# Patient Record
Sex: Female | Born: 1950 | Race: Black or African American | Hispanic: No | Smoking: Former smoker
Health system: Southern US, Community
[De-identification: ages and names within clinical notes are randomized; demographics above are authoritative.]

## PROBLEM LIST (undated history)

## (undated) DIAGNOSIS — R0600 Dyspnea, unspecified: Secondary | ICD-10-CM

## (undated) DIAGNOSIS — I739 Peripheral vascular disease, unspecified: Secondary | ICD-10-CM

## (undated) DIAGNOSIS — I779 Disorder of arteries and arterioles, unspecified: Secondary | ICD-10-CM

## (undated) DIAGNOSIS — R809 Proteinuria, unspecified: Secondary | ICD-10-CM

## (undated) DIAGNOSIS — E119 Type 2 diabetes mellitus without complications: Secondary | ICD-10-CM

## (undated) DIAGNOSIS — E039 Hypothyroidism, unspecified: Secondary | ICD-10-CM

## (undated) DIAGNOSIS — Z87891 Personal history of nicotine dependence: Secondary | ICD-10-CM

## (undated) DIAGNOSIS — I10 Essential (primary) hypertension: Secondary | ICD-10-CM

## (undated) DIAGNOSIS — E1140 Type 2 diabetes mellitus with diabetic neuropathy, unspecified: Secondary | ICD-10-CM

## (undated) DIAGNOSIS — E079 Disorder of thyroid, unspecified: Secondary | ICD-10-CM

## (undated) DIAGNOSIS — B9681 Helicobacter pylori [H. pylori] as the cause of diseases classified elsewhere: Secondary | ICD-10-CM

## (undated) DIAGNOSIS — E11319 Type 2 diabetes mellitus with unspecified diabetic retinopathy without macular edema: Secondary | ICD-10-CM

## (undated) DIAGNOSIS — M1990 Unspecified osteoarthritis, unspecified site: Secondary | ICD-10-CM

## (undated) DIAGNOSIS — D649 Anemia, unspecified: Secondary | ICD-10-CM

## (undated) DIAGNOSIS — E785 Hyperlipidemia, unspecified: Secondary | ICD-10-CM

## (undated) DIAGNOSIS — B351 Tinea unguium: Secondary | ICD-10-CM

## (undated) DIAGNOSIS — M4800 Spinal stenosis, site unspecified: Secondary | ICD-10-CM

## (undated) DIAGNOSIS — R251 Tremor, unspecified: Secondary | ICD-10-CM

## (undated) DIAGNOSIS — K259 Gastric ulcer, unspecified as acute or chronic, without hemorrhage or perforation: Secondary | ICD-10-CM

## (undated) HISTORY — DX: Proteinuria, unspecified: R80.9

## (undated) HISTORY — PX: TONSILLECTOMY: SUR1361

## (undated) HISTORY — DX: Personal history of nicotine dependence: Z87.891

## (undated) HISTORY — DX: Type 2 diabetes mellitus without complications: E11.9

## (undated) HISTORY — DX: Disorder of thyroid, unspecified: E07.9

## (undated) HISTORY — DX: Spinal stenosis, site unspecified: M48.00

## (undated) HISTORY — DX: Essential (primary) hypertension: I10

## (undated) HISTORY — DX: Helicobacter pylori (H. pylori) as the cause of diseases classified elsewhere: K25.9

## (undated) HISTORY — DX: Hyperlipidemia, unspecified: E78.5

## (undated) HISTORY — DX: Tremor, unspecified: R25.1

## (undated) HISTORY — DX: Gastric ulcer, unspecified as acute or chronic, without hemorrhage or perforation: B96.81

---
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
## 1976-03-23 HISTORY — PX: TUBAL LIGATION: SHX77

## 1995-01-22 HISTORY — PX: ABDOMINAL HYSTERECTOMY: SHX81

## 2003-10-16 ENCOUNTER — Emergency Department (HOSPITAL_COMMUNITY): Admission: EM | Admit: 2003-10-16 | Discharge: 2003-10-16 | Payer: Self-pay | Admitting: *Deleted

## 2004-06-10 ENCOUNTER — Emergency Department (HOSPITAL_COMMUNITY): Admission: EM | Admit: 2004-06-10 | Discharge: 2004-06-10 | Payer: Self-pay | Admitting: Emergency Medicine

## 2007-03-24 HISTORY — PX: OTHER SURGICAL HISTORY: SHX169

## 2009-03-25 LAB — HM COLONOSCOPY

## 2011-12-22 LAB — HM DEXA SCAN

## 2012-01-22 LAB — HM MAMMOGRAPHY

## 2012-02-21 HISTORY — PX: DOPPLER ECHOCARDIOGRAPHY: SHX263

## 2013-05-30 ENCOUNTER — Encounter: Payer: Self-pay | Admitting: *Deleted

## 2013-06-01 ENCOUNTER — Ambulatory Visit: Payer: Self-pay | Admitting: Internal Medicine

## 2013-07-10 ENCOUNTER — Ambulatory Visit: Payer: Self-pay | Admitting: Family Medicine

## 2013-10-26 ENCOUNTER — Ambulatory Visit: Payer: Self-pay | Admitting: Family Medicine

## 2013-12-15 DIAGNOSIS — E119 Type 2 diabetes mellitus without complications: Secondary | ICD-10-CM | POA: Insufficient documentation

## 2013-12-15 DIAGNOSIS — E785 Hyperlipidemia, unspecified: Secondary | ICD-10-CM | POA: Insufficient documentation

## 2013-12-15 DIAGNOSIS — I779 Disorder of arteries and arterioles, unspecified: Secondary | ICD-10-CM | POA: Insufficient documentation

## 2013-12-15 DIAGNOSIS — I10 Essential (primary) hypertension: Secondary | ICD-10-CM | POA: Insufficient documentation

## 2013-12-15 DIAGNOSIS — G250 Essential tremor: Secondary | ICD-10-CM | POA: Insufficient documentation

## 2013-12-15 DIAGNOSIS — M48061 Spinal stenosis, lumbar region without neurogenic claudication: Secondary | ICD-10-CM | POA: Insufficient documentation

## 2014-01-19 DIAGNOSIS — R809 Proteinuria, unspecified: Secondary | ICD-10-CM | POA: Insufficient documentation

## 2014-01-19 DIAGNOSIS — H3500 Unspecified background retinopathy: Secondary | ICD-10-CM | POA: Insufficient documentation

## 2014-01-19 DIAGNOSIS — F17200 Nicotine dependence, unspecified, uncomplicated: Secondary | ICD-10-CM | POA: Insufficient documentation

## 2014-10-03 ENCOUNTER — Other Ambulatory Visit: Payer: Self-pay | Admitting: Internal Medicine

## 2014-10-03 DIAGNOSIS — Z1231 Encounter for screening mammogram for malignant neoplasm of breast: Secondary | ICD-10-CM

## 2014-10-10 ENCOUNTER — Other Ambulatory Visit: Payer: Self-pay | Admitting: Family Medicine

## 2014-10-10 ENCOUNTER — Encounter: Payer: Self-pay | Admitting: Family Medicine

## 2014-10-10 ENCOUNTER — Ambulatory Visit
Admission: RE | Admit: 2014-10-10 | Discharge: 2014-10-10 | Disposition: A | Discharge status: Home / Self Care | Payer: Medicare Other | Source: Ambulatory Visit | Attending: Internal Medicine | Admitting: Internal Medicine

## 2014-10-10 DIAGNOSIS — Z87891 Personal history of nicotine dependence: Secondary | ICD-10-CM | POA: Insufficient documentation

## 2014-10-10 DIAGNOSIS — Z1231 Encounter for screening mammogram for malignant neoplasm of breast: Secondary | ICD-10-CM | POA: Diagnosis not present

## 2014-10-10 HISTORY — DX: Personal history of nicotine dependence: Z87.891

## 2014-10-11 ENCOUNTER — Inpatient Hospital Stay: Payer: Medicare Other | Attending: Family Medicine | Admitting: Family Medicine

## 2014-10-11 ENCOUNTER — Ambulatory Visit
Admission: RE | Admit: 2014-10-11 | Discharge: 2014-10-11 | Disposition: A | Discharge status: Home / Self Care | Payer: Medicare Other | Source: Ambulatory Visit | Attending: Family Medicine | Admitting: Family Medicine

## 2014-10-11 DIAGNOSIS — Z122 Encounter for screening for malignant neoplasm of respiratory organs: Secondary | ICD-10-CM

## 2014-10-11 DIAGNOSIS — I2510 Atherosclerotic heart disease of native coronary artery without angina pectoris: Secondary | ICD-10-CM | POA: Diagnosis not present

## 2014-10-11 DIAGNOSIS — Z87891 Personal history of nicotine dependence: Secondary | ICD-10-CM | POA: Insufficient documentation

## 2014-10-11 NOTE — Progress Notes (Signed)
In accordance with CMS guidelines, patient has meet eligibility criteria including age, absence of signs or symptoms of lung cancer, the specific calculation of cigarette smoking pack-years was 48 years and is a current smoker.   A shared decision-making session was conducted prior to the performance of CT scan. This includes one or more decision aids, includes benefits and harms of screening, follow-up diagnostic testing, over-diagnosis, false positive rate, and total radiation exposure.  Counseling on the importance of adherence to annual lung cancer LDCT screening, impact of co-morbidities, and ability or willingness to undergo diagnosis and treatment is imperative for compliance of the program.  Counseling on the importance of continued smoking cessation for former smokers; the importance of smoking cessation for current smokers and information about tobacco cessation interventions have been given to patient including the [place] at [facility], 1800 quit [place], as well as [place] specific smoking cessation programs.  Written order for lung cancer screening with LDCT has been given to the patient and any and all questions have been answered to the best of my abilities.   Yearly follow up will be scheduled by Burgess Estelle, Thoracic Navigator.

## 2014-10-12 ENCOUNTER — Telehealth: Payer: Self-pay | Admitting: *Deleted

## 2014-10-12 NOTE — Telephone Encounter (Signed)
  Oncology Nurse Navigator Documentation    Navigator Encounter Type: Telephone;Screening (10/12/14 1100)               Notified patient of LDCT lung cancer screening results of Lung Rads 2S finding with recommendation for 12 month follow up imaging. Also notified of incidental findings noted below. Patient verbalized understanding. Discussed importance of continuing current medication management for atherosclerotic changes noted but to also discuss with Dr. Ouida Sills at next appointment. This will be forwarded to Dr. Ouida Sills as well.  IMPRESSION: 1. Lung-RADS Category 2S, benign appearance or behavior. Continue annual screening with low-dose chest CT without contrast in 12 months. 2. The "S" modifier above refers to potentially clinically significant non lung cancer related findings. Specifically, there is extensive atherosclerosis, including left main and 2 vessel coronary artery disease. Please note that although the presence of coronary artery calcium documents the presence of coronary artery disease, the severity of this disease and any potential stenosis cannot be assessed on this non-gated CT examination. Assessment for potential risk factor modification, dietary therapy or pharmacologic therapy may be warranted, if clinically indicated.

## 2014-10-30 ENCOUNTER — Telehealth: Payer: Self-pay

## 2014-10-30 NOTE — Telephone Encounter (Signed)
Oncology Nurse Navigator Documentation  Oncology Nurse Navigator Flowsheets 10/12/2014 10/30/2014  Navigator Encounter Type Telephone;Screening Telephone  Time Spent with Patient 15 15   Copt of Low dose CT scan routed thru [facility] to Dr Ouida Sills and hard copy fax sent to his office per pt request, did not receive original copy sent.

## 2014-12-31 DIAGNOSIS — I2510 Atherosclerotic heart disease of native coronary artery without angina pectoris: Secondary | ICD-10-CM | POA: Insufficient documentation

## 2015-07-04 ENCOUNTER — Ambulatory Visit: Payer: Medicare Other | Admitting: Physical Therapy

## 2015-07-09 ENCOUNTER — Encounter: Payer: Medicare Other | Admitting: Physical Therapy

## 2015-07-11 ENCOUNTER — Encounter: Payer: Medicare Other | Admitting: Physical Therapy

## 2015-07-15 ENCOUNTER — Encounter: Payer: Medicare Other | Admitting: Physical Therapy

## 2015-07-18 ENCOUNTER — Encounter: Payer: Medicare Other | Admitting: Physical Therapy

## 2015-07-29 ENCOUNTER — Ambulatory Visit: Payer: Medicare Other | Attending: Internal Medicine | Admitting: Physical Therapy

## 2015-08-01 ENCOUNTER — Encounter: Payer: Medicare Other | Admitting: Physical Therapy

## 2015-08-05 ENCOUNTER — Encounter: Payer: Medicare Other | Admitting: Physical Therapy

## 2015-08-07 DIAGNOSIS — Z0000 Encounter for general adult medical examination without abnormal findings: Secondary | ICD-10-CM | POA: Insufficient documentation

## 2015-08-08 ENCOUNTER — Encounter: Payer: Medicare Other | Admitting: Physical Therapy

## 2015-09-27 ENCOUNTER — Telehealth: Payer: Self-pay | Admitting: *Deleted

## 2015-09-27 NOTE — Telephone Encounter (Signed)
Left voicemail at home number in EMR for patient notifying them that it is time to schedule annual low dose lung cancer screening CT scan. Instructed patient to call back to verify information prior to the scan being scheduled.

## 2015-11-19 ENCOUNTER — Telehealth: Payer: Self-pay | Admitting: *Deleted

## 2015-11-19 NOTE — Telephone Encounter (Signed)
Follow up imaging from last low dose CT lung cancer screening scan is due. Despite multiple attempts at all contact numbers available, have not been able to arrange for CT scan. Letter mailed to patient in final attempt to contact patient. I will be happy to assist in the future if patient so desires. Will forward to referring provider.

## 2016-09-11 ENCOUNTER — Other Ambulatory Visit: Payer: Self-pay | Admitting: Internal Medicine

## 2016-09-11 DIAGNOSIS — M25511 Pain in right shoulder: Secondary | ICD-10-CM

## 2016-09-15 ENCOUNTER — Ambulatory Visit
Admission: RE | Admit: 2016-09-15 | Discharge: 2016-09-15 | Disposition: A | Discharge status: Home / Self Care | Payer: Medicare Other | Source: Ambulatory Visit | Attending: Internal Medicine | Admitting: Internal Medicine

## 2016-09-15 DIAGNOSIS — M62511 Muscle wasting and atrophy, not elsewhere classified, right shoulder: Secondary | ICD-10-CM | POA: Diagnosis not present

## 2016-09-15 DIAGNOSIS — M75121 Complete rotator cuff tear or rupture of right shoulder, not specified as traumatic: Secondary | ICD-10-CM | POA: Insufficient documentation

## 2016-09-15 DIAGNOSIS — M7551 Bursitis of right shoulder: Secondary | ICD-10-CM | POA: Diagnosis not present

## 2016-09-15 DIAGNOSIS — M25511 Pain in right shoulder: Secondary | ICD-10-CM | POA: Insufficient documentation

## 2016-09-24 DIAGNOSIS — M75120 Complete rotator cuff tear or rupture of unspecified shoulder, not specified as traumatic: Secondary | ICD-10-CM | POA: Insufficient documentation

## 2016-10-13 ENCOUNTER — Encounter
Admission: RE | Admit: 2016-10-13 | Discharge: 2016-10-13 | Disposition: A | Discharge status: Home / Self Care | Payer: Medicare Other | Source: Ambulatory Visit | Attending: Orthopedic Surgery | Admitting: Orthopedic Surgery

## 2016-10-13 ENCOUNTER — Other Ambulatory Visit: Payer: Self-pay | Admitting: Orthopedic Surgery

## 2016-10-13 DIAGNOSIS — Z9889 Other specified postprocedural states: Secondary | ICD-10-CM | POA: Diagnosis not present

## 2016-10-13 DIAGNOSIS — F17210 Nicotine dependence, cigarettes, uncomplicated: Secondary | ICD-10-CM | POA: Diagnosis not present

## 2016-10-13 DIAGNOSIS — Z90710 Acquired absence of both cervix and uterus: Secondary | ICD-10-CM | POA: Insufficient documentation

## 2016-10-13 DIAGNOSIS — Z01812 Encounter for preprocedural laboratory examination: Secondary | ICD-10-CM | POA: Insufficient documentation

## 2016-10-13 DIAGNOSIS — M75121 Complete rotator cuff tear or rupture of right shoulder, not specified as traumatic: Secondary | ICD-10-CM | POA: Insufficient documentation

## 2016-10-13 HISTORY — DX: Anemia, unspecified: D64.9

## 2016-10-13 LAB — BASIC METABOLIC PANEL
Anion gap: 9 (ref 5–15)
BUN: 26 mg/dL — AB (ref 6–20)
CALCIUM: 9.2 mg/dL (ref 8.9–10.3)
CO2: 25 mmol/L (ref 22–32)
CREATININE: 0.98 mg/dL (ref 0.44–1.00)
Chloride: 106 mmol/L (ref 101–111)
GFR calc Af Amer: >60 mL/min (ref >60)
GFR, EST NON AFRICAN AMERICAN: 58 mL/min — AB (ref >60)
GLUCOSE: 130 mg/dL — AB (ref 65–99)
POTASSIUM: 4.3 mmol/L (ref 3.5–5.1)
Sodium: 140 mmol/L (ref 135–145)

## 2016-10-13 LAB — CBC WITH DIFFERENTIAL/PLATELET
BASOS ABS: 0.1 10*3/uL (ref 0–0.1)
Basophils Relative: 1 %
EOS PCT: 1 %
Eosinophils Absolute: 0.1 10*3/uL (ref 0–0.7)
HCT: 34.1 % — ABNORMAL LOW (ref 35.0–47.0)
Hemoglobin: 10.8 g/dL — ABNORMAL LOW (ref 12.0–16.0)
LYMPHS PCT: 36 %
Lymphs Abs: 3.1 10*3/uL (ref 1.0–3.6)
MCH: 27 pg (ref 26.0–34.0)
MCHC: 31.7 g/dL — ABNORMAL LOW (ref 32.0–36.0)
MCV: 85.2 fL (ref 80.0–100.0)
MONO ABS: 0.6 10*3/uL (ref 0.2–0.9)
Monocytes Relative: 7 %
Neutro Abs: 4.8 10*3/uL (ref 1.4–6.5)
Neutrophils Relative %: 55 %
PLATELETS: 312 10*3/uL (ref 150–440)
RBC: 4 MIL/uL (ref 3.80–5.20)
RDW: 16.1 % — AB (ref 11.5–14.5)
WBC: 8.6 10*3/uL (ref 3.6–11.0)

## 2016-10-13 LAB — PROTIME-INR
INR: 0.92
Prothrombin Time: 12.3 seconds (ref 11.4–15.2)

## 2016-10-13 LAB — APTT: APTT: 28 s (ref 24–36)

## 2016-10-13 NOTE — Patient Instructions (Signed)
  Your procedure is scheduled on: Tuesday October 20, 2016. Report to Same Day Surgery. To find out your arrival time please call [phone] between 1PM - 3PM on Monday October 19, 2016.  Remember: Instructions that are not followed completely may result in serious medical risk, up to and including death, or upon the discretion of your surgeon and anesthesiologist your surgery may need to be rescheduled.    _x___ 1. Do not eat food or drink liquids after midnight. No gum chewing or hard candies.     _x__ 2. No Alcohol for 24 hours before or after surgery.   ____ 3. Bring all medications with you on the day of surgery if instructed.    __x__ 4. Notify your doctor if there is any change in your medical condition     (cold, fever, infections).    _____ 5. No smoking 24 hours prior to surgery.     Do not wear jewelry, make-up, hairpins, clips or nail polish.  Do not wear lotions, powders, or perfumes.   Do not shave 48 hours prior to surgery. Men may shave face and neck.  Do not bring valuables to the hospital.    [facility] is not responsible for any belongings or valuables.               Contacts, dentures or bridgework may not be worn into surgery.  Leave your suitcase in the car. After surgery it may be brought to your room.  For patients admitted to the hospital, discharge time is determined by your treatment team.   Patients discharged the day of surgery will not be allowed to drive home.    Please read over the following fact sheets that you were given:   [facility] Preparing for Surgery  __x__ Take these medicines the morning of surgery with A SIP OF WATER:    1. diltiazem (CARDIZEM CD)   2. losartan (COZAAR)  ____ Fleet Enema (as directed)   _x___ Use CHG Soap as directed on instruction sheet  ____ Use inhalers on the day of surgery and bring to hospital day of surgery  _x___ Stop metformin 2 days prior to surgery.    _x___ Take 1/2 of usual insulin dose the night  before surgery and none on the morning of surgery.   x__ Stop aspirin on 7 days prior to surgery.  _x___ Stop Anti-inflammatories such as Advil, Aleve, Ibuprofen, Motrin, Naproxen, Naprosyn, Goodies powders or aspirin  products. OK to take Tylenol.   _x___ Stop supplements:Krill Oil & Coenzyme Q10 (CO Q-10) & ferrous sulfate 325 (65 FE) until after surgery.    ____ Bring C-Pap to the hospital.

## 2016-10-13 NOTE — Pre-Procedure Instructions (Signed)
Request for medical records faxed to Dr. Laurelyn Sickle office for Stress test, Echo and any labs.

## 2016-10-19 MED ORDER — CEFAZOLIN SODIUM-DEXTROSE 2-4 GM/100ML-% IV SOLN
2.0000 g | INTRAVENOUS | Status: AC
  Administered 2016-10-20: 2 g via INTRAVENOUS

## 2016-10-19 NOTE — Pre-Procedure Instructions (Signed)
See requested clearance, EKG, stress test results in front of pt's chart.

## 2016-10-20 ENCOUNTER — Encounter: Payer: Self-pay | Admitting: Anesthesiology

## 2016-10-20 ENCOUNTER — Ambulatory Visit: Payer: Medicare Other | Admitting: Anesthesiology

## 2016-10-20 ENCOUNTER — Encounter
Admission: RE | Disposition: A | Discharge status: Home / Self Care | Payer: Self-pay | Source: Ambulatory Visit | Attending: Orthopedic Surgery

## 2016-10-20 ENCOUNTER — Ambulatory Visit
Admission: RE | Admit: 2016-10-20 | Discharge: 2016-10-20 | Disposition: A | Discharge status: Home / Self Care | Payer: Medicare Other | Source: Ambulatory Visit | Attending: Orthopedic Surgery | Admitting: Orthopedic Surgery

## 2016-10-20 DIAGNOSIS — K279 Peptic ulcer, site unspecified, unspecified as acute or chronic, without hemorrhage or perforation: Secondary | ICD-10-CM | POA: Diagnosis not present

## 2016-10-20 DIAGNOSIS — F17200 Nicotine dependence, unspecified, uncomplicated: Secondary | ICD-10-CM | POA: Diagnosis not present

## 2016-10-20 DIAGNOSIS — E079 Disorder of thyroid, unspecified: Secondary | ICD-10-CM | POA: Diagnosis not present

## 2016-10-20 DIAGNOSIS — D649 Anemia, unspecified: Secondary | ICD-10-CM | POA: Diagnosis not present

## 2016-10-20 DIAGNOSIS — E119 Type 2 diabetes mellitus without complications: Secondary | ICD-10-CM | POA: Insufficient documentation

## 2016-10-20 DIAGNOSIS — M19011 Primary osteoarthritis, right shoulder: Secondary | ICD-10-CM | POA: Diagnosis not present

## 2016-10-20 DIAGNOSIS — Y939 Activity, unspecified: Secondary | ICD-10-CM | POA: Insufficient documentation

## 2016-10-20 DIAGNOSIS — X500XXA Overexertion from strenuous movement or load, initial encounter: Secondary | ICD-10-CM | POA: Insufficient documentation

## 2016-10-20 DIAGNOSIS — Z90710 Acquired absence of both cervix and uterus: Secondary | ICD-10-CM | POA: Insufficient documentation

## 2016-10-20 DIAGNOSIS — S46011A Strain of muscle(s) and tendon(s) of the rotator cuff of right shoulder, initial encounter: Secondary | ICD-10-CM | POA: Diagnosis not present

## 2016-10-20 DIAGNOSIS — X509XXA Other and unspecified overexertion or strenuous movements or postures, initial encounter: Secondary | ICD-10-CM | POA: Insufficient documentation

## 2016-10-20 DIAGNOSIS — X58XXXA Exposure to other specified factors, initial encounter: Secondary | ICD-10-CM | POA: Diagnosis not present

## 2016-10-20 DIAGNOSIS — Y93E9 Activity, other interior property and clothing maintenance: Secondary | ICD-10-CM | POA: Diagnosis not present

## 2016-10-20 DIAGNOSIS — E7800 Pure hypercholesterolemia, unspecified: Secondary | ICD-10-CM | POA: Insufficient documentation

## 2016-10-20 DIAGNOSIS — I10 Essential (primary) hypertension: Secondary | ICD-10-CM | POA: Diagnosis not present

## 2016-10-20 DIAGNOSIS — M7541 Impingement syndrome of right shoulder: Secondary | ICD-10-CM | POA: Diagnosis not present

## 2016-10-20 DIAGNOSIS — M75121 Complete rotator cuff tear or rupture of right shoulder, not specified as traumatic: Secondary | ICD-10-CM | POA: Diagnosis present

## 2016-10-20 DIAGNOSIS — S46111A Strain of muscle, fascia and tendon of long head of biceps, right arm, initial encounter: Secondary | ICD-10-CM | POA: Diagnosis not present

## 2016-10-20 HISTORY — PX: SHOULDER ARTHROSCOPY WITH ROTATOR CUFF REPAIR: SHX5685

## 2016-10-20 LAB — GLUCOSE, CAPILLARY
GLUCOSE-CAPILLARY: 182 mg/dL — AB (ref 65–99)
Glucose-Capillary: 185 mg/dL — ABNORMAL HIGH (ref 65–99)

## 2016-10-20 SURGERY — ARTHROSCOPY, SHOULDER, WITH ROTATOR CUFF REPAIR
Anesthesia: General | Laterality: Right

## 2016-10-20 MED ORDER — FENTANYL CITRATE (PF) 100 MCG/2ML IJ SOLN
50.0000 ug | Freq: Once | INTRAMUSCULAR | Status: AC
  Administered 2016-10-20: 50 ug via INTRAVENOUS

## 2016-10-20 MED ORDER — ROPIVACAINE HCL 5 MG/ML IJ SOLN
INTRAMUSCULAR | Status: DC | PRN
  Administered 2016-10-20: 20 mL via EPIDURAL

## 2016-10-20 MED ORDER — OXYCODONE HCL 5 MG PO TABS: 5.0000 mg | ORAL_TABLET | Freq: Four times a day (QID) | ORAL | 0 refills | Status: DC | PRN

## 2016-10-20 MED ORDER — SODIUM CHLORIDE 0.9 % IV SOLN
INTRAVENOUS | Status: DC
  Administered 2016-10-20 (×2): via INTRAVENOUS

## 2016-10-20 MED ORDER — FENTANYL CITRATE (PF) 100 MCG/2ML IJ SOLN: 25.0000 ug | INTRAMUSCULAR | Status: DC | PRN

## 2016-10-20 MED ORDER — FENTANYL CITRATE (PF) 100 MCG/2ML IJ SOLN
INTRAMUSCULAR | Status: DC | PRN
  Administered 2016-10-20: 100 ug via INTRAVENOUS
  Administered 2016-10-20: 50 ug via INTRAVENOUS

## 2016-10-20 MED ORDER — FENTANYL CITRATE (PF) 100 MCG/2ML IJ SOLN
INTRAMUSCULAR | Status: AC
  Administered 2016-10-20: 50 ug via INTRAVENOUS
  Filled 2016-10-20: qty 2

## 2016-10-20 MED ORDER — PROPOFOL 10 MG/ML IV BOLUS
INTRAVENOUS | Status: AC
  Filled 2016-10-20: qty 40

## 2016-10-20 MED ORDER — FENTANYL CITRATE (PF) 250 MCG/5ML IJ SOLN
INTRAMUSCULAR | Status: AC
  Filled 2016-10-20: qty 5

## 2016-10-20 MED ORDER — CEFAZOLIN SODIUM-DEXTROSE 2-4 GM/100ML-% IV SOLN
INTRAVENOUS | Status: AC
  Filled 2016-10-20: qty 100

## 2016-10-20 MED ORDER — MIDAZOLAM HCL 2 MG/2ML IJ SOLN
1.0000 mg | Freq: Once | INTRAMUSCULAR | Status: AC
  Administered 2016-10-20: 1 mg via INTRAVENOUS

## 2016-10-20 MED ORDER — ONDANSETRON HCL 4 MG/2ML IJ SOLN
INTRAMUSCULAR | Status: DC | PRN
  Administered 2016-10-20: 4 mg via INTRAVENOUS

## 2016-10-20 MED ORDER — PHENYLEPHRINE HCL 10 MG/ML IJ SOLN
INTRAMUSCULAR | Status: DC | PRN
  Administered 2016-10-20 (×2): 100 ug via INTRAVENOUS

## 2016-10-20 MED ORDER — MIDAZOLAM HCL 2 MG/2ML IJ SOLN
INTRAMUSCULAR | Status: AC
  Filled 2016-10-20: qty 2

## 2016-10-20 MED ORDER — MIDAZOLAM HCL 2 MG/2ML IJ SOLN
INTRAMUSCULAR | Status: AC
  Administered 2016-10-20: 1 mg via INTRAVENOUS
  Filled 2016-10-20: qty 2

## 2016-10-20 MED ORDER — CHLORHEXIDINE GLUCONATE CLOTH 2 % EX PADS: 6.0000 | MEDICATED_PAD | Freq: Once | CUTANEOUS | Status: DC

## 2016-10-20 MED ORDER — CHLORHEXIDINE GLUCONATE CLOTH 2 % EX PADS
6.0000 | MEDICATED_PAD | Freq: Once | CUTANEOUS | Status: AC
  Administered 2016-10-20: 6 via TOPICAL

## 2016-10-20 MED ORDER — PROPOFOL 10 MG/ML IV BOLUS
INTRAVENOUS | Status: DC | PRN
  Administered 2016-10-20: 150 mg via INTRAVENOUS

## 2016-10-20 MED ORDER — ONDANSETRON HCL 4 MG/2ML IJ SOLN: 4.0000 mg | Freq: Once | INTRAMUSCULAR | Status: DC | PRN

## 2016-10-20 MED ORDER — FAMOTIDINE 20 MG PO TABS
20.0000 mg | ORAL_TABLET | Freq: Once | ORAL | Status: AC
  Administered 2016-10-20: 20 mg via ORAL

## 2016-10-20 MED ORDER — ACETAMINOPHEN 10 MG/ML IV SOLN
INTRAVENOUS | Status: DC | PRN
  Administered 2016-10-20: 1000 mg via INTRAVENOUS

## 2016-10-20 MED ORDER — LIDOCAINE HCL (PF) 1 % IJ SOLN
INTRAMUSCULAR | Status: AC
  Filled 2016-10-20: qty 30

## 2016-10-20 MED ORDER — ROPIVACAINE HCL 5 MG/ML IJ SOLN
INTRAMUSCULAR | Status: AC
  Filled 2016-10-20: qty 60

## 2016-10-20 MED ORDER — EPHEDRINE SULFATE 50 MG/ML IJ SOLN
INTRAMUSCULAR | Status: DC | PRN
  Administered 2016-10-20 (×4): 10 mg via INTRAVENOUS

## 2016-10-20 MED ORDER — ONDANSETRON HCL 4 MG PO TABS: 4.0000 mg | ORAL_TABLET | Freq: Three times a day (TID) | ORAL | 0 refills | Status: DC | PRN

## 2016-10-20 MED ORDER — ONDANSETRON HCL 4 MG/2ML IJ SOLN
INTRAMUSCULAR | Status: AC
  Filled 2016-10-20: qty 2

## 2016-10-20 MED ORDER — ACETAMINOPHEN 10 MG/ML IV SOLN
INTRAVENOUS | Status: AC
  Filled 2016-10-20: qty 100

## 2016-10-20 MED ORDER — SUGAMMADEX SODIUM 200 MG/2ML IV SOLN
INTRAVENOUS | Status: DC | PRN
  Administered 2016-10-20: 200 mg via INTRAVENOUS

## 2016-10-20 MED ORDER — ROCURONIUM BROMIDE 50 MG/5ML IV SOLN
INTRAVENOUS | Status: AC
  Filled 2016-10-20: qty 1

## 2016-10-20 MED ORDER — LIDOCAINE HCL (CARDIAC) 20 MG/ML IV SOLN
INTRAVENOUS | Status: DC | PRN
  Administered 2016-10-20: 100 mg via INTRAVENOUS

## 2016-10-20 MED ORDER — BUPIVACAINE HCL (PF) 0.25 % IJ SOLN
INTRAMUSCULAR | Status: AC
  Filled 2016-10-20: qty 30

## 2016-10-20 MED ORDER — EPINEPHRINE 30 MG/30ML IJ SOLN
INTRAMUSCULAR | Status: AC
  Filled 2016-10-20: qty 1

## 2016-10-20 MED ORDER — NEOMYCIN-POLYMYXIN B GU 40-200000 IR SOLN
Status: AC
  Filled 2016-10-20: qty 2

## 2016-10-20 MED ORDER — FAMOTIDINE 20 MG PO TABS
ORAL_TABLET | ORAL | Status: AC
  Administered 2016-10-20: 20 mg via ORAL
  Filled 2016-10-20: qty 1

## 2016-10-20 MED ORDER — LACTATED RINGERS IV SOLN: INTRAVENOUS | Status: DC | PRN

## 2016-10-20 SURGICAL SUPPLY — 72 items
ADAPTER IRRIG TUBE 2 SPIKE SOL (ADAPTER) ×6 IMPLANT
ADPR TBG 2 SPK PMP STRL ASCP (ADAPTER) ×2
ANCH SUT Q-FX 2.8 (Anchor) ×1 IMPLANT
ANCHOR ALL-SUT Q-FIX 2.8 (Anchor) ×2 IMPLANT
BUR RADIUS 4.0X18.5 (BURR) ×3 IMPLANT
BUR RADIUS 5.5 (BURR) ×3 IMPLANT
CANISTER SUCT LVC 12 LTR MEDI- (MISCELLANEOUS) IMPLANT
CANNULA 5.75X7 CRYSTAL CLEAR (CANNULA) ×6 IMPLANT
CANNULA PARTIAL THREAD 2X7 (CANNULA) ×3 IMPLANT
CANNULA TWIST IN 8.25X9CM (CANNULA) IMPLANT
CLOSURE WOUND 1/2 X4 (GAUZE/BANDAGES/DRESSINGS) ×2
CONNECTOR PERFECT PASSER (CONNECTOR) ×2 IMPLANT
COOLER POLAR GLACIER W/PUMP (MISCELLANEOUS) ×3 IMPLANT
CRADLE LAMINECT ARM (MISCELLANEOUS) ×3 IMPLANT
DEVICE SUCT BLK HOLE OR FLOOR (MISCELLANEOUS) IMPLANT
DRAPE IMP U-DRAPE 54X76 (DRAPES) ×6 IMPLANT
DRAPE INCISE IOBAN 66X45 STRL (DRAPES) ×3 IMPLANT
DRAPE SHEET LG 3/4 BI-LAMINATE (DRAPES) ×3 IMPLANT
DRAPE U-SHAPE 47X51 STRL (DRAPES) IMPLANT
DURAPREP 26ML APPLICATOR (WOUND CARE) ×9 IMPLANT
ELECT REM PT RETURN 9FT ADLT (ELECTROSURGICAL) ×3
ELECTRODE REM PT RTRN 9FT ADLT (ELECTROSURGICAL) ×1 IMPLANT
GAUZE PETRO XEROFOAM 1X8 (MISCELLANEOUS) ×3 IMPLANT
GAUZE SPONGE 4X4 12PLY STRL (GAUZE/BANDAGES/DRESSINGS) ×6 IMPLANT
GLOVE BIOGEL PI IND STRL 9 (GLOVE) ×1 IMPLANT
GLOVE BIOGEL PI INDICATOR 9 (GLOVE) ×2
GLOVE SURG 9.0 ORTHO LTXF (GLOVE) ×6 IMPLANT
GOWN STRL REUS TWL 2XL XL LVL4 (GOWN DISPOSABLE) ×3 IMPLANT
GOWN STRL REUS W/ TWL LRG LVL3 (GOWN DISPOSABLE) ×1 IMPLANT
GOWN STRL REUS W/ TWL LRG LVL4 (GOWN DISPOSABLE) ×1 IMPLANT
GOWN STRL REUS W/TWL LRG LVL3 (GOWN DISPOSABLE) ×3
GOWN STRL REUS W/TWL LRG LVL4 (GOWN DISPOSABLE) ×3
IV LACTATED RINGER IRRG 3000ML (IV SOLUTION) ×30
IV LR IRRIG 3000ML ARTHROMATIC (IV SOLUTION) ×6 IMPLANT
KIT RM TURNOVER STRD PROC AR (KITS) ×3 IMPLANT
KIT STABILIZATION SHOULDER (MISCELLANEOUS) ×3 IMPLANT
KIT SUTURE 2.8 Q-FIX DISP (MISCELLANEOUS) ×2 IMPLANT
KIT SUTURETAK 3.0 INSERT PERC (KITS) IMPLANT
MANIFOLD NEPTUNE II (INSTRUMENTS) ×3 IMPLANT
MASK FACE SPIDER DISP (MASK) ×3 IMPLANT
MAT BLUE FLOOR 46X72 FLO (MISCELLANEOUS) ×6 IMPLANT
NDL SAFETY 18GX1.5 (NEEDLE) ×3 IMPLANT
NDL SAFETY 22GX1.5 (NEEDLE) ×3 IMPLANT
NS IRRIG 500ML POUR BTL (IV SOLUTION) ×3 IMPLANT
PACK ARTHROSCOPY SHOULDER (MISCELLANEOUS) ×3 IMPLANT
PAD WRAPON POLAR SHDR XLG (MISCELLANEOUS) ×1 IMPLANT
PASSER SUT CAPTURE FIRST (SUTURE) ×2 IMPLANT
SET TUBE SUCT SHAVER OUTFL 24K (TUBING) ×3 IMPLANT
SET TUBE TIP INTRA-ARTICULAR (MISCELLANEOUS) ×3 IMPLANT
STRAP SAFETY BODY (MISCELLANEOUS) ×3 IMPLANT
STRIP CLOSURE SKIN 1/2X4 (GAUZE/BANDAGES/DRESSINGS) ×4 IMPLANT
SUT ETHILON 4-0 (SUTURE) ×6
SUT ETHILON 4-0 FS2 18XMFL BLK (SUTURE) ×2
SUT KNTLS 2.8 MAGNUM (Anchor) ×6 IMPLANT
SUT LASSO 90 DEG SD STR (SUTURE) IMPLANT
SUT MNCRL 4-0 (SUTURE) ×3
SUT MNCRL 4-0 27XMFL (SUTURE) ×1
SUT PDS AB 0 CT1 27 (SUTURE) ×3 IMPLANT
SUT PERFECTPASSER WHITE CART (SUTURE) ×4 IMPLANT
SUT SMART STITCH CARTRIDGE (SUTURE) ×2 IMPLANT
SUT VIC AB 0 CT1 36 (SUTURE) ×3 IMPLANT
SUT VIC AB 2-0 CT2 27 (SUTURE) ×3 IMPLANT
SUTURE ETHLN 4-0 FS2 18XMF BLK (SUTURE) ×1 IMPLANT
SUTURE MAGNUM WIRE 2X48 BLK (SUTURE) IMPLANT
SUTURE MNCRL 4-0 27XMF (SUTURE) ×1 IMPLANT
SYRINGE 10CC LL (SYRINGE) ×3 IMPLANT
TAPE MICROFOAM 4IN (TAPE) ×3 IMPLANT
TUBING ARTHRO INFLOW-ONLY STRL (TUBING) ×3 IMPLANT
TUBING CONNECTING 10 (TUBING) ×2 IMPLANT
TUBING CONNECTING 10' (TUBING) ×1
WAND HAND CNTRL MULTIVAC 90 (MISCELLANEOUS) ×3 IMPLANT
WRAPON POLAR PAD SHDR XLG (MISCELLANEOUS) ×3

## 2016-10-20 NOTE — Anesthesia Procedure Notes (Signed)
Procedure Name: Intubation Date/Time: 10/20/2016 10:01 AM Performed by: Justus Memory Pre-anesthesia Checklist: Patient identified, Patient being monitored, Timeout performed, Emergency Drugs available and Suction available Patient Re-evaluated:Patient Re-evaluated prior to induction Oxygen Delivery Method: Circle system utilized Preoxygenation: Pre-oxygenation with 100% oxygen Induction Type: IV induction Ventilation: Mask ventilation without difficulty Laryngoscope Size: Mac and 3 Grade View: Grade I Tube type: Oral Tube size: 7.0 mm Number of attempts: 1 Airway Equipment and Method: Stylet Placement Confirmation: ETT inserted through vocal cords under direct vision,  positive ETCO2 and breath sounds checked- equal and bilateral Secured at: 21 cm Tube secured with: Tape Dental Injury: Teeth and Oropharynx as per pre-operative assessment

## 2016-10-20 NOTE — Transfer of Care (Signed)
Immediate Anesthesia Transfer of Care Note  Patient: Hannah Sullivan  Procedure(s) Performed: Procedure(s): SHOULDER ARTHROSCOPY WITH OPEN ROTATOR CUFF REPAIR (Right)  Patient Location: PACU  Anesthesia Type:General  Level of Consciousness: sedated  Airway & Oxygen Therapy: Patient Spontanous Breathing and Patient connected to face mask oxygen  Post-op Assessment: Report given to RN and Post -op Vital signs reviewed and stable  Post vital signs: Reviewed and stable  Last Vitals:  Vitals:   10/20/16 0937 10/20/16 0942  BP: (!) 107/55 (!) 115/48  Pulse: 72 67  Resp: 14 (!) 23  Temp:      Last Pain:  Vitals:   10/20/16 0937  TempSrc:   PainSc: 0-No pain         Complications: No apparent anesthesia complications

## 2016-10-20 NOTE — Anesthesia Post-op Follow-up Note (Cosign Needed)
Anesthesia QCDR form completed.        

## 2016-10-20 NOTE — Anesthesia Postprocedure Evaluation (Signed)
Anesthesia Post Note  Patient: Hannah Sullivan  Procedure(s) Performed: Procedure(s) (LRB): SHOULDER ARTHROSCOPY WITH OPEN ROTATOR CUFF REPAIR (Right)  Patient location during evaluation: PACU Anesthesia Type: General Level of consciousness: awake and alert Pain management: pain level controlled Vital Signs Assessment: post-procedure vital signs reviewed and stable Respiratory status: spontaneous breathing, nonlabored ventilation, respiratory function stable and patient connected to nasal cannula oxygen Cardiovascular status: blood pressure returned to baseline and stable Postop Assessment: no signs of nausea or vomiting Anesthetic complications: no     Last Vitals:  Vitals:   10/20/16 1438 10/20/16 1448  BP: (!) 96/54 (!) 102/50  Pulse: 66 80  Resp: 20 20  Temp:  (!) 35.7 C    Last Pain:  Vitals:   10/20/16 1448  TempSrc: Tympanic  PainSc:                  Jamekia Gannett S

## 2016-10-20 NOTE — Anesthesia Preprocedure Evaluation (Signed)
Anesthesia Evaluation  Patient identified by MRN, date of birth, ID band Patient awake    Reviewed: Allergy & Precautions, NPO status , Patient's Chart, lab work & pertinent test results, reviewed documented beta blocker date and time   Airway Mallampati: II  TM Distance: >3 FB     Dental  (+) Chipped   Pulmonary Current Smoker,           Cardiovascular hypertension, Pt. on medications      Neuro/Psych    GI/Hepatic PUD,   Endo/Other  diabetes, Type 2  Renal/GU      Musculoskeletal   Abdominal   Peds  Hematology  (+) anemia ,   Anesthesia Other Findings   Reproductive/Obstetrics                             Anesthesia Physical Anesthesia Plan  ASA: III  Anesthesia Plan: General   Post-op Pain Management:    Induction: Intravenous  PONV Risk Score and Plan:   Airway Management Planned: Oral ETT  Additional Equipment:   Intra-op Plan:   Post-operative Plan:   Informed Consent: I have reviewed the patients History and Physical, chart, labs and discussed the procedure including the risks, benefits and alternatives for the proposed anesthesia with the patient or authorized representative who has indicated his/her understanding and acceptance.     Plan Discussed with: CRNA  Anesthesia Plan Comments:         Anesthesia Quick Evaluation

## 2016-10-20 NOTE — OR Nursing (Signed)
Pt c/o right eye feeling "tight".  Pt keeps rubbing eye. Right eye slightly red and puffy underneath.  Given cold wet washcloth and a small ice pack applied.

## 2016-10-20 NOTE — Op Note (Signed)
10/20/2016  1:30 PM  PATIENT:  Hannah Sullivan  66 y.o. female  PRE-OPERATIVE DIAGNOSIS:  Complete rotator cuff tear or rupture of right shoulder, shoulder impingement, acromioclavicular joint arthrosis, possible biceps tendon tear  POST-OPERATIVE DIAGNOSIS:  Complete rotator cuff tear or rupture of right shoulder, shoulder impingement, acromioclavicular joint arthrosis, partial biceps tendon tear   PROCEDURE:  Procedure(s): SHOULDER ARTHROSCOPY WITH OPEN ROTATOR CUFF REPAIR (Right)  SURGEON:  Surgeon(s) and Role:    Thornton Park, MD - Primary  ANESTHESIA:   local and paracervical block   PREOPERATIVE INDICATIONS:  Hannah Sullivan is a  66 y.o. female with a diagnosis of Complete rotator cuff tear or rupture of right shoulder who failed conservative measures and elected for surgical management.    The risks benefits and alternatives were discussed with the patient preoperatively including but not limited to the risks of infection, bleeding, nerve injury, persistent pain or weakness, failure of the hardware, re-tear of the rotator cuff and the need for further surgery. Medical risks include DVT and pulmonary embolism, myocardial infarction, stroke, pneumonia, respiratory failure and death. Patient understood these risks and wished to proceed.  OPERATIVE IMPLANTS: ArthroCare Magnum 2 anchors x 3 & Smith and Nephew Q Fix anchors x 2  OPERATIVE PROCEDURE: The patient was met in the preoperative area. The right shoulder was signed with the word yes and my initials according the hospital's correct site of surgery protocol. Patient had a interscalene block placed by the anesthesia service in the preoperative area. The patient was brought to the OR and underwent general endotracheal intubation by the anesthesia service.  The patient was placed in a beachchair position. A spider arm positioner was used for this case. Examination under anesthesia revealed no limitation of motion, there was no  instability or sulcus sign.  The patient was prepped and draped in a sterile fashion. A timeout was performed to verify the patient's name, date of birth, medical record number, correct site of surgery and correct procedure to be performed there was also used to verify the patient received antibiotics that all appropriate instruments, implants and radiographs studies were available in the room. Once all in attendance were in agreement case began.  Bony landmarks were drawn out with a surgical marker along with proposed arthroscopy incisions. These were pre-injected with 1% lidocaine plain. An 11 blade was used to establish a posterior portal through which the arthroscope was placed in the glenohumeral joint. A full diagnostic examination of the shoulder was performed. The anterior portal was established under direct visualization with an 18-gauge spinal needle.  A 5.75 mm arthroscopic cannula was placed through the anterior portal.   The intra-articular portion of the biceps tendon was found to have a partial tear involving greater than 50% of the diameter. Therefore the decision was made to perform a tenotomy. An arthroscopic scissor was used to release the biceps tendon off the superior labrum. The arthroscopic shaver was then used to debride the frayed edges of the labrum superior. There were no anterior or superior labral tears seen.  Subscapularis tendon was intact. Patient had a full-thickness tear involving the supraspinatus and infraspinatus with retraction. There were no loose bodies within the inferior recess and no evidence of HAGL lesion.  The arthroscope was then placed in the subacromial space. A lateral portal was then established using an 18-gauge spinal needle for localization.   The greater tuberosity was debrided using a 5.5 mm resector shaver blade to remove all remaining foreign fibers of the  rotator cuff.  Debridement was performed until punctate bleeding was seen at the greater  tuberosity footprint, which will allow for rotator cuff healing.  Extensive bursitis was encountered and debrided using a 4-0 resector shaver blade and a 90 ArthroCare wand from the lateral portal. A subacromial decompression was also performed using a 5.5 mm resector shaver blade from the lateral portal. The 5.5 mm resector shaver blade was then placed through the anterior portal and distal clavicle excision was performed. Three ArthroCare Perfect Pass sutures were placed in the lateral border of the rotator cuff tear. All arthroscopic instruments were then removed and the mini-open portion of the procedure began.   A saber-type incision was made along the lateral border of the acromion. The deltoid muscle was identified and split in line with its fibers which allowed visualization of the rotator cuff. The Perfect Pass sutures previously placed in the lateral border of the rotator cuff werealso brought out through the deltoid split. Two Q-Fix anchors were then placed at the articular margin of the humeral head and greater tuberosity. The four suture limbs of both Q Fix anchors were passed medially through the rotator cuff using a first pass suture passer. The Perfect Pass sutures from the lateral border of the rotator cuff were then anchored to thegreater tuberosity of the humeral head using three Magnum 2 anchors. These anchors were tensioned to allow for anatomic reduction of the rotator cuff to the greater tuberosity footprint. The medial row repair was then completed using an arthroscopic knot tying technique with the Q fix anchor sutures. Once all sutures were tied down, arthroscopic images of the double row repair were taken with the arthroscope both externally and arthroscopically fromthe glenohumeral joint  All incisions were copiously irrigated. The deltoid fascia was repaired using a 0 Vicryl suturean interrupted fashion. The subcutaneous tissue of all incisions were closed with a 2-0  Vicryl. Skin closure for the arthroscopic incisions was performed with 4-0 nylon. The skin edges of the saber incision were approximated with a running 4-0 undyed Monocryl.  A dry sterile dressing including Steri-Strips was applied . The patient was placed in an abduction sling, with a Polar Care sleeve.  All sharp and instrument counts were correct at the conclusion of the case. I was scrubbed and present for the entire case. I spoke with the patient's granddaughter in the post-op consultation room and informed her that the case had been performed without complication and the patient was stable in recovery room.     Timoteo Gaul, MD

## 2016-10-20 NOTE — Anesthesia Procedure Notes (Signed)
Anesthesia Regional Block: Interscalene brachial plexus block   Pre-Anesthetic Checklist: ,, timeout performed, Correct Patient, Correct Site, Correct Laterality, Correct Procedure, Correct Position, site marked, Risks and benefits discussed,  Surgical consent,  Pre-op evaluation,  At surgeon's request and post-op pain management   Prep: Betadine       Needles:  Injection technique: Single-shot  Needle Type: Echogenic Stimulator Needle     Needle Length: 5cm  Needle Gauge: 21     Additional Needles:   Procedures: ultrasound guided, nerve stimulator,,,,,,   Nerve Stimulator or Paresthesia:  Response: biceps flexion, 0.8 mA,   Additional Responses:   Narrative:  Injection made incrementally with aspirations every 5 mL.  Performed by: Personally  Anesthesiologist: Gunnar Bulla  Additional Notes: Functioning IV was confirmed and monitors were applied.  A 41mm 22ga Arrow echogenic stimulator needle was used. Sterile prep and drape,hand hygiene and sterile gloves were used.  Negative aspiration and negative test dose prior to incremental administration of local anesthetic. The patient tolerated the procedure well.  Ultrasound guidance: relevent anatomy identified, needle position confirmed, local anesthetic spread visualized around nerve(s), vascular puncture avoided.  Image printed for medical record. 22ml ropivi and 27ml NS.

## 2016-10-20 NOTE — Discharge Instructions (Signed)

## 2016-10-20 NOTE — H&P (Signed)
The patient has been re-examined, and the chart reviewed, and there have been no interval changes to the documented history and physical.    The risks, benefits, and alternatives have been discussed at length, and the patient is willing to proceed.   

## 2016-12-22 DIAGNOSIS — M545 Low back pain, unspecified: Secondary | ICD-10-CM | POA: Insufficient documentation

## 2017-03-26 ENCOUNTER — Other Ambulatory Visit: Payer: Self-pay | Admitting: Internal Medicine

## 2017-03-26 DIAGNOSIS — Z1231 Encounter for screening mammogram for malignant neoplasm of breast: Secondary | ICD-10-CM

## 2017-05-07 ENCOUNTER — Ambulatory Visit
Admission: RE | Admit: 2017-05-07 | Discharge: 2017-05-07 | Disposition: A | Discharge status: Home / Self Care | Payer: Medicare Other | Source: Ambulatory Visit | Attending: Internal Medicine | Admitting: Internal Medicine

## 2017-05-07 DIAGNOSIS — Z1231 Encounter for screening mammogram for malignant neoplasm of breast: Secondary | ICD-10-CM | POA: Diagnosis present

## 2017-12-08 ENCOUNTER — Inpatient Hospital Stay: Payer: Medicare Other | Attending: Oncology | Admitting: Oncology

## 2017-12-08 ENCOUNTER — Encounter: Payer: Self-pay | Admitting: Oncology

## 2017-12-08 ENCOUNTER — Other Ambulatory Visit: Payer: Self-pay

## 2017-12-08 VITALS — BP 153/63 | HR 71 | Temp 97.2°F | Resp 18 | Ht 61.0 in | Wt 148.4 lb

## 2017-12-08 DIAGNOSIS — F17210 Nicotine dependence, cigarettes, uncomplicated: Secondary | ICD-10-CM | POA: Insufficient documentation

## 2017-12-08 DIAGNOSIS — E119 Type 2 diabetes mellitus without complications: Secondary | ICD-10-CM | POA: Insufficient documentation

## 2017-12-08 DIAGNOSIS — Z7982 Long term (current) use of aspirin: Secondary | ICD-10-CM | POA: Insufficient documentation

## 2017-12-08 DIAGNOSIS — Z794 Long term (current) use of insulin: Secondary | ICD-10-CM | POA: Diagnosis not present

## 2017-12-08 DIAGNOSIS — Z79899 Other long term (current) drug therapy: Secondary | ICD-10-CM | POA: Diagnosis not present

## 2017-12-08 DIAGNOSIS — D509 Iron deficiency anemia, unspecified: Secondary | ICD-10-CM | POA: Diagnosis present

## 2017-12-08 DIAGNOSIS — I10 Essential (primary) hypertension: Secondary | ICD-10-CM | POA: Diagnosis not present

## 2017-12-08 DIAGNOSIS — E785 Hyperlipidemia, unspecified: Secondary | ICD-10-CM | POA: Insufficient documentation

## 2017-12-08 NOTE — Progress Notes (Signed)
Hematology/Oncology Consult note [facility] Telephone:([phone] [phone]   Patient Care Team: Perrin Maltese, MD as PCP - General (Internal Medicine)  REFERRING PROVIDER: Perrin Maltese, MD CHIEF COMPLAINTS/REASON FOR VISIT:  Evaluation of iron deficiency anemia  HISTORY OF PRESENTING ILLNESS:  Hannah Sullivan is a  67 y.o.  female with PMH listed below who was referred to me for evaluation of iron deficiency anemia Patient follows up with primary care physician Dr. Yancey Flemings recently had labs done on 12/01/2017 at [facility] clinic. Labs reviewed.  CBC showed hemoglobin 7.6, hematocrit 27, MCV 76.5, WBC 10.8, platelet count 425.  Differential is normal CMP showed normal creatinine and bilirubin level. 11/18/2017 iron panel showed TIBC 537, saturation 2, 11/18/2017 ferritin 7. History of iron deficiency: She recalls remotely when she was pregnant she used to take iron supplementation.  Currently she takes oral iron supplementation since she got her blood results. Rectal bleeding: Denies reports that she has dark stool after taking oral iron supplementation. Menstrual bleeding/ Vaginal bleeding : Denies Hematemesis or hemoptysis : denies Blood in urine : denies  Pica: Denies Last endoscopy: She had a colon[place] in [place] [facility] Fatigue: reports worsening fatigue. Chronic onset, perisistent, no aggravating or improving factors, no associated symptoms.  SOB: denies Denies weight loss, fever or chills, abdominal pain, chest pain.  Review of Systems  Constitutional: Positive for malaise/fatigue. Negative for chills, fever and weight loss.  HENT: Negative for nosebleeds and sore throat.   Eyes: Negative for double vision, photophobia and redness.  Respiratory: Negative for cough, shortness of breath and wheezing.   Cardiovascular: Negative for chest pain, palpitations and orthopnea.  Gastrointestinal: Positive for constipation. Negative for  abdominal pain, blood in stool, nausea and vomiting.  Genitourinary: Negative for dysuria.  Musculoskeletal: Negative for back pain, myalgias and neck pain.  Skin: Negative for itching and rash.  Neurological: Negative for dizziness, tingling and tremors.  Endo/Heme/Allergies: Negative for environmental allergies. Does not bruise/bleed easily.  Psychiatric/Behavioral: Negative for depression.    MEDICAL HISTORY:  Past Medical History:  Diagnosis Date  . Anemia   . Diabetes mellitus without complication ([place])   . Hyperlipidemia   . Hypertension   . Personal history of tobacco use, presenting hazards to health 10/10/2014  . Protein in urine   . Pyloric ulcer associated with Helicobacter pylori   . Spinal stenosis   . Thyroid disease 1980's   treated with radioactive iodine  . Tremors of nervous system     SURGICAL HISTORY: Past Surgical History:  Procedure Laterality Date  . ABDOMINAL HYSTERECTOMY  1196   Dr [facility], [place], [place] Dr Shana [place]  . CESAREAN SECTION  1971/1978   2 c-sections, Dr [facility], [place], [place]  . [place] ECHOCARDIOGRAPHY  02/2012   Left Corptid Artery-neck, Dr Chryl Heck [facility]  . endoscope  2009   Dr Maceo Pro, [place] ARTHROSCOPY WITH ROTATOR CUFF REPAIR Right 10/20/2016   Procedure: SHOULDER ARTHROSCOPY WITH OPEN ROTATOR CUFF REPAIR;  Surgeon: Thornton Park, MD;  Location: [facility] ORS;  Service: Orthopedics;  Laterality: Right;  . TONSILECTOMY/ADENOIDECTOMY WITH MYRINGOTOMY  1961  . TONSILLECTOMY    . [place]    SOCIAL HISTORY: Social History   Socioeconomic History  . Marital status: Widowed    Spouse name: Not on file  . Number of children: Not on file  . Years of education: Not on file  .  Highest education level: Not on file  Occupational History  . Not on file  Social Needs  . Financial resource strain: Not on file  . Food insecurity:    Worry:  Not on file    Inability: Not on file  . Transportation needs:    Medical: Not on file    Non-medical: Not on file  Tobacco Use  . Smoking status: Current Every Day Smoker    Packs/day: 0.50    Years: 40.00    Pack years: 20.00  . Smokeless tobacco: Never Used  Substance and Sexual Activity  . Alcohol use: No    Comment: 1 beer every 3 months  . Drug use: No  . Sexual activity: Not on file  Lifestyle  . Physical activity:    Days per week: Not on file    Minutes per session: Not on file  . Stress: Not on file  Relationships  . Social connections:    Talks on phone: Not on file    Gets together: Not on file    Attends religious service: Not on file    Active member of club or organization: Not on file    Attends meetings of clubs or organizations: Not on file    Relationship status: Not on file  . Intimate partner violence:    Fear of current or ex partner: Not on file    Emotionally abused: Not on file    Physically abused: Not on file    Forced sexual activity: Not on file  Other Topics Concern  . Not on file  Social History Narrative  . Not on file    FAMILY HISTORY: Family History  Problem Relation Age of Onset  . COPD Mother   . Diabetes Mother   . Osteoporosis Mother   . Heart attack Sister   . Diabetes Sister   . Arthritis Sister   . Breast cancer Neg Hx     ALLERGIES:  has No Known Allergies.  MEDICATIONS:  Current Outpatient Medications  Medication Sig Dispense Refill  . aspirin EC 81 MG tablet Take 81 mg by mouth daily.    Marland Kitchen atorvastatin (LIPITOR) 40 MG tablet Take 40 mg by mouth daily at 6 PM.     . cholecalciferol (VITAMIN D) 400 units TABS tablet Take 400 Units by mouth daily.    . Coenzyme Q10 (CO Q-10) 200 MG CAPS Take 200 mg by mouth daily.    . Cyanocobalamin (VITAMIN B 12 PO) Take 2,500 mcg by mouth 1 day or 1 dose.    . diltiazem (CARDIZEM CD) 360 MG 24 hr capsule Take 360 mg by mouth daily. In am.    . ferrous sulfate 325 (65 FE) MG  tablet Take 325 mg by mouth daily.    . hydrochlorothiazide (HYDRODIURIL) 25 MG tablet Take 25 mg by mouth every morning.     . insulin detemir (LEVEMIR) 100 UNIT/ML injection Inject 30-45 Units into the skin 2 (two) times daily. 30 units in the morning before breakfast & 45 units in the evening before supper    . insulin lispro (HUMALOG) 100 UNIT/ML injection Inject 25 Units into the skin 3 (three) times daily before meals.    Javier Docker Oil 350 MG CAPS Take 350 mg by mouth daily at 3 pm.    . losartan (COZAAR) 100 MG tablet Take 100 mg by mouth daily. In am.    . Menthol, Topical Analgesic, (BIOFREEZE ROLL-ON EX) Apply 1 application topically 4 (four) times  daily as needed (for pain.).    Marland Kitchen metFORMIN (GLUCOPHAGE) 1000 MG tablet Take 1,000 mg by mouth 2 (two) times daily after a meal.     . Multiple Vitamins-Minerals (PRESERVISION AREDS 2 PO) Take 1 tablet by mouth daily at 3 pm.    . ondansetron (ZOFRAN) 4 MG tablet Take 1 tablet (4 mg total) by mouth every 8 (eight) hours as needed for nausea or vomiting. 30 tablet 0  . oxyCODONE (OXY IR/ROXICODONE) 5 MG immediate release tablet Take 1 tablet (5 mg total) by mouth every 6 (six) hours as needed for severe pain. 40 tablet 0   No current facility-administered medications for this visit.      PHYSICAL EXAMINATION: ECOG PERFORMANCE STATUS: 1 - Symptomatic but completely ambulatory Vitals:   12/08/17 1007  BP: (!) 153/63  Pulse: 71  Resp: 18  Temp: (!) 97.2 F (36.2 C)   Filed Weights   12/08/17 1007  Weight: 148 lb 6.4 oz (67.3 kg)    Physical Exam  Constitutional: She is oriented to person, place, and time. No distress.  HENT:  Head: Normocephalic and atraumatic.  Mouth/Throat: Oropharynx is clear and moist.  Eyes: Pupils are equal, round, and reactive to light. EOM are normal. No scleral icterus.  Neck: Normal range of motion. Neck supple.  Cardiovascular: Normal rate, regular rhythm and normal heart sounds.  Pulmonary/Chest:  Effort normal. No respiratory distress. She has no wheezes.  Abdominal: Soft. Bowel sounds are normal. She exhibits no distension and no mass. There is no tenderness.  Musculoskeletal: Normal range of motion. She exhibits no edema or deformity.  Neurological: She is alert and oriented to person, place, and time. No cranial nerve deficit.  Chronic head tremor  Skin: Skin is warm and dry. No rash noted. No erythema.  Psychiatric: She has a normal mood and affect.     LABORATORY DATA:  I have reviewed the data as listed Lab Results  Component Value Date   WBC 8.6 10/13/2016   HGB 10.8 (L) 10/13/2016   HCT 34.1 (L) 10/13/2016   MCV 85.2 10/13/2016   PLT 312 10/13/2016   No results for input(s): NA, K, CL, CO2, GLUCOSE, BUN, CREATININE, CALCIUM, GFRNONAA, GFRAA, PROT, ALBUMIN, AST, ALT, ALKPHOS, BILITOT, BILIDIR, IBILI in the last 8760 hours. Iron/TIBC/Ferritin/ %Sat No results found for: IRON, TIBC, FERRITIN, IRONPCTSAT      ASSESSMENT & PLAN:  1. Iron deficiency anemia, unspecified iron deficiency anemia type    Labs reviewed and discussed with patient.  Consistent with severe iron deficiency anemia. Plan IV iron with Venofer 200mg  weekly x 4 doses. Allergy reactions/infusion reaction including anaphylactic reaction discussed with patient. Other side effects include but not limited to high blood pressure, skin rash, weight gain, leg swelling, etc. Patient voices understanding and willing to proceed. Recommend patient to follow-up with gastroenterology and have colonoscopy for further evaluation.  Per patient she has an appointment for colonoscopy in October. Repeat CBC, iron, TIBC, ferritin, in 8 weeks for reassessment for additional Venofer. Orders Placed This Encounter  Procedures  . CBC with Differential/Platelet    Standing Status:   Future    Standing Expiration Date:   12/09/2018  . Iron and TIBC    Standing Status:   Future    Standing Expiration Date:   12/09/2018  .  Ferritin    Standing Status:   Future    Standing Expiration Date:   12/09/2018    All questions were answered. The patient knows to call  the clinic with any problems questions or concerns.  Return of visit: 8 weeks Thank you for this kind referral and the opportunity to participate in the care of this patient. A copy of today's note is routed to referring provider  Total face to face encounter time for this patient visit was 45 min. >50% of the time was  spent in counseling and coordination of care.    Earlie Server, MD, PhD Hematology Oncology [facility] at [facility] Pager- [phone] 12/08/2017

## 2017-12-08 NOTE — Progress Notes (Signed)
Patient here for initial visit. Pt states she is currenty smoking 3-4 cigarettes a day and is slowly quitting.

## 2017-12-16 ENCOUNTER — Inpatient Hospital Stay: Payer: Medicare Other

## 2017-12-16 VITALS — BP 164/71 | HR 80 | Temp 98.3°F | Resp 20

## 2017-12-16 DIAGNOSIS — D509 Iron deficiency anemia, unspecified: Secondary | ICD-10-CM

## 2017-12-16 MED ORDER — IRON SUCROSE 20 MG/ML IV SOLN
200.0000 mg | Freq: Once | INTRAVENOUS | Status: AC
  Administered 2017-12-16: 200 mg via INTRAVENOUS
  Filled 2017-12-16: qty 10

## 2017-12-16 MED ORDER — SODIUM CHLORIDE 0.9 % IV SOLN
Freq: Once | INTRAVENOUS | Status: AC
  Administered 2017-12-16: 14:00:00 via INTRAVENOUS
  Filled 2017-12-16: qty 250

## 2017-12-16 MED ORDER — SODIUM CHLORIDE 0.9 % IV SOLN: 200.0000 mg | Freq: Once | INTRAVENOUS | Status: DC

## 2017-12-23 ENCOUNTER — Inpatient Hospital Stay: Payer: Medicare Other | Attending: Oncology

## 2017-12-23 VITALS — BP 138/72 | HR 82 | Temp 98.3°F | Resp 20

## 2017-12-23 DIAGNOSIS — F17210 Nicotine dependence, cigarettes, uncomplicated: Secondary | ICD-10-CM | POA: Insufficient documentation

## 2017-12-23 DIAGNOSIS — Z79899 Other long term (current) drug therapy: Secondary | ICD-10-CM | POA: Diagnosis not present

## 2017-12-23 DIAGNOSIS — D509 Iron deficiency anemia, unspecified: Secondary | ICD-10-CM

## 2017-12-23 MED ORDER — SODIUM CHLORIDE 0.9 % IV SOLN: 200.0000 mg | Freq: Once | INTRAVENOUS | Status: DC

## 2017-12-23 MED ORDER — SODIUM CHLORIDE 0.9 % IV SOLN
Freq: Once | INTRAVENOUS | Status: AC
  Administered 2017-12-23: 14:00:00 via INTRAVENOUS
  Filled 2017-12-23: qty 250

## 2017-12-23 MED ORDER — IRON SUCROSE 20 MG/ML IV SOLN
200.0000 mg | Freq: Once | INTRAVENOUS | Status: AC
  Administered 2017-12-23: 200 mg via INTRAVENOUS
  Filled 2017-12-23: qty 10

## 2017-12-24 ENCOUNTER — Telehealth: Payer: Self-pay | Admitting: *Deleted

## 2017-12-24 NOTE — Telephone Encounter (Signed)
-----   Message from [place] sent at 12/23/2017  1:33 PM EDT ----- Is having a colonscopy on 10/16 suppose to stop iron on 10/9 wants to know what needs to be done about the 10/10 appt and can she come the next day 10/17 to get her other treatment done

## 2017-12-24 NOTE — Telephone Encounter (Signed)
Per Dr Tasia Catchings advised pt okay to receive IV Iron prior to colonoscopy.   Advised her that GI was probably referring to stopping the oral iron supplement.  Advised pt to contact GI office to verify as she was very unsure of our advice

## 2017-12-30 ENCOUNTER — Inpatient Hospital Stay: Payer: Medicare Other

## 2017-12-30 DIAGNOSIS — D509 Iron deficiency anemia, unspecified: Secondary | ICD-10-CM

## 2017-12-30 MED ORDER — SODIUM CHLORIDE 0.9 % IV SOLN
Freq: Once | INTRAVENOUS | Status: AC
  Administered 2017-12-30: 14:00:00 via INTRAVENOUS
  Filled 2017-12-30: qty 250

## 2017-12-30 MED ORDER — IRON SUCROSE 20 MG/ML IV SOLN: 200.0000 mg | Freq: Once | INTRAVENOUS | Status: DC

## 2017-12-30 MED ORDER — IRON SUCROSE 20 MG/ML IV SOLN
200.0000 mg | Freq: Once | INTRAVENOUS | Status: AC
  Administered 2017-12-30: 200 mg via INTRAVENOUS
  Filled 2017-12-30: qty 10

## 2018-01-04 ENCOUNTER — Encounter: Payer: Self-pay | Admitting: *Deleted

## 2018-01-05 ENCOUNTER — Ambulatory Visit
Admission: RE | Admit: 2018-01-05 | Discharge: 2018-01-05 | Disposition: A | Discharge status: Home / Self Care | Payer: Medicare Other | Source: Ambulatory Visit | Attending: Internal Medicine | Admitting: Internal Medicine

## 2018-01-05 ENCOUNTER — Encounter
Admission: RE | Disposition: A | Discharge status: Home / Self Care | Payer: Self-pay | Source: Ambulatory Visit | Attending: Internal Medicine

## 2018-01-05 ENCOUNTER — Ambulatory Visit: Admit: 2018-01-05 | Payer: Medicare Other | Admitting: Gastroenterology

## 2018-01-05 ENCOUNTER — Encounter: Payer: Self-pay | Admitting: *Deleted

## 2018-01-05 ENCOUNTER — Ambulatory Visit: Payer: Medicare Other | Admitting: Certified Registered"

## 2018-01-05 DIAGNOSIS — D125 Benign neoplasm of sigmoid colon: Secondary | ICD-10-CM | POA: Insufficient documentation

## 2018-01-05 DIAGNOSIS — F17200 Nicotine dependence, unspecified, uncomplicated: Secondary | ICD-10-CM | POA: Insufficient documentation

## 2018-01-05 DIAGNOSIS — Z7983 Long term (current) use of bisphosphonates: Secondary | ICD-10-CM | POA: Insufficient documentation

## 2018-01-05 DIAGNOSIS — K2970 Gastritis, unspecified, without bleeding: Secondary | ICD-10-CM | POA: Diagnosis not present

## 2018-01-05 DIAGNOSIS — Z794 Long term (current) use of insulin: Secondary | ICD-10-CM | POA: Insufficient documentation

## 2018-01-05 DIAGNOSIS — I2510 Atherosclerotic heart disease of native coronary artery without angina pectoris: Secondary | ICD-10-CM | POA: Diagnosis not present

## 2018-01-05 DIAGNOSIS — Z7982 Long term (current) use of aspirin: Secondary | ICD-10-CM | POA: Diagnosis not present

## 2018-01-05 DIAGNOSIS — K640 First degree hemorrhoids: Secondary | ICD-10-CM | POA: Diagnosis not present

## 2018-01-05 DIAGNOSIS — D123 Benign neoplasm of transverse colon: Secondary | ICD-10-CM | POA: Insufficient documentation

## 2018-01-05 DIAGNOSIS — D509 Iron deficiency anemia, unspecified: Secondary | ICD-10-CM | POA: Diagnosis present

## 2018-01-05 DIAGNOSIS — E11319 Type 2 diabetes mellitus with unspecified diabetic retinopathy without macular edema: Secondary | ICD-10-CM | POA: Insufficient documentation

## 2018-01-05 DIAGNOSIS — E785 Hyperlipidemia, unspecified: Secondary | ICD-10-CM | POA: Diagnosis not present

## 2018-01-05 DIAGNOSIS — R195 Other fecal abnormalities: Secondary | ICD-10-CM | POA: Insufficient documentation

## 2018-01-05 DIAGNOSIS — Z79899 Other long term (current) drug therapy: Secondary | ICD-10-CM | POA: Insufficient documentation

## 2018-01-05 DIAGNOSIS — I10 Essential (primary) hypertension: Secondary | ICD-10-CM | POA: Diagnosis not present

## 2018-01-05 DIAGNOSIS — D124 Benign neoplasm of descending colon: Secondary | ICD-10-CM | POA: Insufficient documentation

## 2018-01-05 DIAGNOSIS — E1140 Type 2 diabetes mellitus with diabetic neuropathy, unspecified: Secondary | ICD-10-CM | POA: Insufficient documentation

## 2018-01-05 DIAGNOSIS — K449 Diaphragmatic hernia without obstruction or gangrene: Secondary | ICD-10-CM | POA: Insufficient documentation

## 2018-01-05 HISTORY — PX: COLONOSCOPY WITH PROPOFOL: SHX5780

## 2018-01-05 HISTORY — DX: Type 2 diabetes mellitus with diabetic neuropathy, unspecified: E11.40

## 2018-01-05 HISTORY — DX: Type 2 diabetes mellitus with unspecified diabetic retinopathy without macular edema: E11.319

## 2018-01-05 HISTORY — DX: Tinea unguium: B35.1

## 2018-01-05 HISTORY — DX: Proteinuria, unspecified: R80.9

## 2018-01-05 HISTORY — PX: ESOPHAGOGASTRODUODENOSCOPY (EGD) WITH PROPOFOL: SHX5813

## 2018-01-05 HISTORY — DX: Disorder of arteries and arterioles, unspecified: I77.9

## 2018-01-05 HISTORY — DX: Dyspnea, unspecified: R06.00

## 2018-01-05 HISTORY — DX: Hypothyroidism, unspecified: E03.9

## 2018-01-05 HISTORY — DX: Peripheral vascular disease, unspecified: I73.9

## 2018-01-05 HISTORY — DX: Unspecified osteoarthritis, unspecified site: M19.90

## 2018-01-05 LAB — GLUCOSE, CAPILLARY: Glucose-Capillary: 132 mg/dL — ABNORMAL HIGH (ref 70–99)

## 2018-01-05 SURGERY — COLONOSCOPY WITH PROPOFOL
Anesthesia: General

## 2018-01-05 MED ORDER — PROPOFOL 10 MG/ML IV BOLUS
INTRAVENOUS | Status: AC
  Filled 2018-01-05: qty 40

## 2018-01-05 MED ORDER — PHENYLEPHRINE HCL 10 MG/ML IJ SOLN
INTRAMUSCULAR | Status: DC | PRN
  Administered 2018-01-05: 200 ug via INTRAVENOUS

## 2018-01-05 MED ORDER — EPHEDRINE SULFATE 50 MG/ML IJ SOLN
INTRAMUSCULAR | Status: DC | PRN
  Administered 2018-01-05 (×2): 10 mg via INTRAVENOUS

## 2018-01-05 MED ORDER — LIDOCAINE HCL (CARDIAC) PF 100 MG/5ML IV SOSY
PREFILLED_SYRINGE | INTRAVENOUS | Status: DC | PRN
  Administered 2018-01-05: 80 mg via INTRAVENOUS

## 2018-01-05 MED ORDER — SODIUM CHLORIDE 0.9 % IV SOLN
INTRAVENOUS | Status: DC
  Administered 2018-01-05: 1000 mL via INTRAVENOUS

## 2018-01-05 MED ORDER — PROPOFOL 500 MG/50ML IV EMUL
INTRAVENOUS | Status: DC | PRN
  Administered 2018-01-05: 100 ug/kg/min via INTRAVENOUS

## 2018-01-05 MED ORDER — FENTANYL CITRATE (PF) 100 MCG/2ML IJ SOLN
INTRAMUSCULAR | Status: AC
  Filled 2018-01-05: qty 2

## 2018-01-05 MED ORDER — FENTANYL CITRATE (PF) 100 MCG/2ML IJ SOLN
INTRAMUSCULAR | Status: DC | PRN
  Administered 2018-01-05 (×2): 50 ug via INTRAVENOUS

## 2018-01-05 MED ORDER — PROPOFOL 10 MG/ML IV BOLUS
INTRAVENOUS | Status: DC | PRN
  Administered 2018-01-05: 70 mg via INTRAVENOUS
  Administered 2018-01-05: 30 mg via INTRAVENOUS

## 2018-01-05 NOTE — Interval H&P Note (Signed)
History and Physical Interval Note:  01/05/2018 9:36 AM  Hannah Sullivan  has presented today for surgery, with the diagnosis of IDA  The various methods of treatment have been discussed with the patient and family. After consideration of risks, benefits and other options for treatment, the patient has consented to  Procedure(s): COLONOSCOPY WITH PROPOFOL (N/A) ESOPHAGOGASTRODUODENOSCOPY (EGD) WITH PROPOFOL (N/A) as a surgical intervention .  The patient's history has been reviewed, patient examined, no change in status, stable for surgery.  I have reviewed the patient's chart and labs.  Questions were answered to the patient's satisfaction.     [place], [place]

## 2018-01-05 NOTE — Anesthesia Postprocedure Evaluation (Signed)
Anesthesia Post Note  Patient: [name]  Procedure(s) Performed: COLONOSCOPY WITH PROPOFOL (N/A ) ESOPHAGOGASTRODUODENOSCOPY (EGD) WITH PROPOFOL (N/A )  Patient location during evaluation: Endoscopy Anesthesia Type: General Level of consciousness: awake and alert Pain management: pain level controlled Vital Signs Assessment: post-procedure vital signs reviewed and stable Respiratory status: spontaneous breathing, nonlabored ventilation, respiratory function stable and patient connected to nasal cannula oxygen Cardiovascular status: blood pressure returned to baseline and stable Postop Assessment: no apparent nausea or vomiting Anesthetic complications: no     Last Vitals:  Vitals:   01/05/18 0906 01/05/18 1022  BP: (!) 172/65 (!) 120/52  Pulse: 71 66  Resp: 20 20  Temp: (!) 36.2 C (!) 36.3 C  SpO2: 100% 98%    Last Pain:  Vitals:   01/05/18 1138  TempSrc:   PainSc: 0-No pain                 Martha Clan

## 2018-01-05 NOTE — Op Note (Signed)
[facility] Gastroenterology Patient Name: Hannah Sullivan Procedure Date: 01/05/2018 9:28 AM MRN: [id number] Account #: [id number] Date of Birth: Jan 02, 1951 Admit Type: Outpatient Age: 67 Room: [facility] ENDO ROOM 3 Gender: Female Note Status: Finalized Procedure:            Upper GI endoscopy Indications:          Suspected upper gastrointestinal bleeding in patient                        with unexplained iron deficiency anemia, Heme positive                        stool Providers:            Benay Pike. [name] MD, MD Referring MD:         Perrin Maltese, MD (Referring MD) Medicines:            Propofol per Anesthesia Complications:        No immediate complications. Procedure:            Pre-Anesthesia Assessment:                       - The risks and benefits of the procedure and the                        sedation options and risks were discussed with the                        patient. All questions were answered and informed                        consent was obtained.                       - Patient identification and proposed procedure were                        verified prior to the procedure by the nurse. The                        procedure was verified in the procedure room.                       - ASA Grade Assessment: III - A patient with severe                        systemic disease.                       - After reviewing the risks and benefits, the patient                        was deemed in satisfactory condition to undergo the                        procedure.                       After obtaining informed consent, the endoscope was  passed under direct vision. Throughout the procedure,                        the patient's blood pressure, pulse, and oxygen                        saturations were monitored continuously. The Endoscope                        was introduced through the mouth, and advanced to the        third part of duodenum. The upper GI endoscopy was                        accomplished without difficulty. The patient tolerated                        the procedure well. Findings:      The examined esophagus was normal.      Patchy moderate inflammation characterized by congestion (edema),       erosions and erythema was found in the gastric body, in the gastric       antrum and in the prepyloric region of the stomach.      A 1 cm hiatal hernia was present.      The examined duodenum was normal.      The exam was otherwise without abnormality. Impression:           - Normal esophagus.                       - Gastritis.                       - 1 cm hiatal hernia.                       - Normal examined duodenum.                       - The examination was otherwise normal.                       - No specimens collected. Recommendation:       - Proceed with colonoscopy Procedure Code(s):    --- Professional ---                       [phone], Esophagogastroduodenoscopy, flexible, transoral;                        diagnostic, including collection of specimen(s) by                        brushing or washing, when performed (separate procedure) Diagnosis Code(s):    --- Professional ---                       R19.5, Other fecal abnormalities                       D50.9, Iron deficiency anemia, unspecified                       K44.9, Diaphragmatic hernia without obstruction or  gangrene                       K29.70, Gastritis, unspecified, without bleeding CPT copyright 2018 American Medical Association. All rights reserved. The codes documented in this report are preliminary and upon coder review may  be revised to meet current compliance requirements. Efrain Sella MD, MD 01/05/2018 9:58:25 AM This report has been signed electronically. Number of Addenda: 0 Note Initiated On: 01/05/2018 9:28 AM      [facility]

## 2018-01-05 NOTE — Anesthesia Post-op Follow-up Note (Signed)
Anesthesia QCDR form completed.        

## 2018-01-05 NOTE — Transfer of Care (Signed)
Immediate Anesthesia Transfer of Care Note  Patient: Hannah Sullivan  Procedure(s) Performed: COLONOSCOPY WITH PROPOFOL (N/A ) ESOPHAGOGASTRODUODENOSCOPY (EGD) WITH PROPOFOL (N/A )  Patient Location: PACU  Anesthesia Type:General  Level of Consciousness: awake and sedated  Airway & Oxygen Therapy: Patient Spontanous Breathing and Patient connected to nasal cannula oxygen  Post-op Assessment: Report given to RN and Post -op Vital signs reviewed and stable  Post vital signs: Reviewed and stable  Last Vitals:  Vitals Value Taken Time  BP 120/52 01/05/2018 10:22 AM  Temp 36.3 C 01/05/2018 10:22 AM  Pulse 63 01/05/2018 10:22 AM  Resp 22 01/05/2018 10:22 AM  SpO2 98 % 01/05/2018 10:22 AM  Vitals shown include unvalidated device data.  Last Pain:  Vitals:   01/05/18 1022  TempSrc: Tympanic  PainSc: 0-No pain         Complications: No apparent anesthesia complications

## 2018-01-05 NOTE — Op Note (Signed)
[facility] Gastroenterology Patient Name: Hannah Sullivan Procedure Date: 01/05/2018 9:27 AM MRN: [id number] Account #: [id number] Date of Birth: [birth year] Admit Type: Outpatient Age: 67 Room: [facility] ENDO ROOM 3 Gender: Female Note Status: Finalized Procedure:            Colonoscopy Indications:          Heme positive stool, Unexplained iron deficiency anemia Providers:            Benay Pike. [name] MD, MD Referring MD:         Perrin Maltese, MD (Referring MD) Medicines:            Propofol per Anesthesia Complications:        No immediate complications. Procedure:            Pre-Anesthesia Assessment:                       - The risks and benefits of the procedure and the                        sedation options and risks were discussed with the                        patient. All questions were answered and informed                        consent was obtained.                       - Patient identification and proposed procedure were                        verified prior to the procedure by the nurse. The                        procedure was verified in the procedure room.                       - ASA Grade Assessment: III - A patient with severe                        systemic disease.                       - After reviewing the risks and benefits, the patient                        was deemed in satisfactory condition to undergo the                        procedure.                       After obtaining informed consent, the colonoscope was                        passed under direct vision. Throughout the procedure,                        the patient's blood pressure, pulse, and oxygen  saturations were monitored continuously. The                        Colonoscope was introduced through the anus and                        advanced to the the cecum, identified by appendiceal                        orifice and ileocecal valve. The colonoscopy  was                        performed without difficulty. The patient tolerated the                        procedure well. The quality of the bowel preparation                        was good. The ileocecal valve, appendiceal orifice, and                        rectum were photographed. Findings:      The perianal and digital rectal examinations were normal. Pertinent       negatives include normal sphincter tone and no palpable rectal lesions.      Three sessile polyps were found in the sigmoid colon, descending colon       and transverse colon. The polyps were 3 to 4 mm in size. These polyps       were removed with a jumbo cold forceps. Resection and retrieval were       complete.      A 6 mm polyp was found in the distal descending colon. The polyp was       sessile. The polyp was removed with a cold snare. Resection and       retrieval were complete.      Non-bleeding internal hemorrhoids were found during retroflexion. The       hemorrhoids were Grade I (internal hemorrhoids that do not prolapse).      The exam was otherwise without abnormality. Impression:           - Three 3 to 4 mm polyps in the sigmoid colon, in the                        descending colon and in the transverse colon, removed                        with a jumbo cold forceps. Resected and retrieved.                       - One 6 mm polyp in the distal descending colon,                        removed with a cold snare. Resected and retrieved.                       - Non-bleeding internal hemorrhoids.                       - The examination was otherwise normal. Recommendation:       -  Patient has a contact number available for                        emergencies. The signs and symptoms of potential                        delayed complications were discussed with the patient.                        Return to normal activities tomorrow. Written discharge                        instructions were provided to the  patient.                       - Resume previous diet.                       - Continue present medications.                       - Repeat colonoscopy is recommended for surveillance.                        The colonoscopy date will be determined after pathology                        results from today's exam become available for review.                       - Return to physician assistant in 2 months.                       - To visualize the small bowel, perform video capsule                        endoscopy in 3 weeks.                       - The findings and recommendations were discussed with                        the patient and their family. Procedure Code(s):    --- Professional ---                       [phone], Colonoscopy, flexible; with removal of tumor(s),                        polyp(s), or other lesion(s) by snare technique                       [id number], 30, Colonoscopy, flexible; with biopsy, single                        or multiple Diagnosis Code(s):    --- Professional ---                       D50.9, Iron deficiency anemia, unspecified                       R19.5, Other fecal abnormalities  K64.0, First degree hemorrhoids                       D12.5, Benign neoplasm of sigmoid colon                       D12.4, Benign neoplasm of descending colon                       D12.3, Benign neoplasm of transverse colon (hepatic                        flexure or splenic flexure) CPT copyright 2018 American Medical Association. All rights reserved. The codes documented in this report are preliminary and upon coder review may  be revised to meet current compliance requirements. Efrain Sella MD, MD 01/05/2018 10:19:29 AM This report has been signed electronically. Number of Addenda: 0 Note Initiated On: 01/05/2018 9:27 AM Scope Withdrawal Time: 0 hours 9 minutes 2 seconds  Total Procedure Duration: 0 hours 15 minutes 23 seconds       [facility]

## 2018-01-05 NOTE — H&P (Signed)
Outpatient short stay form Pre-procedure 01/05/2018 9:33 AM Hannah Sullivan K. [name], M.D.  Primary Physician: Lamonte Sakai, M.D.  Reason for visit:  Iron deficiency anemia, Hemoccult positive stool.   History of present illness:  67 y/o female presents with iron deficiency anemia and was noted to be hemoccult positive of FOBT. Denies abdominal pain, nausea, vomiting, melena, hemetemesis or gross bright red blood per rectum.     Current Facility-Administered Medications:  .  0.9 %  sodium chloride infusion, , Intravenous, Continuous, Mannie Wineland, Benay Pike, MD  Medications Prior to Admission  Medication Sig Dispense Refill Last Dose  . atorvastatin (LIPITOR) 40 MG tablet Take 40 mg by mouth daily at 6 PM.    01/05/2018 at Unknown time  . diltiazem (CARDIZEM CD) 360 MG 24 hr capsule Take 360 mg by mouth daily. In am.   01/05/2018 at Unknown time  . ergocalciferol (VITAMIN D2) 50000 units capsule Take 50,000 Units by mouth once a week.     . folic acid (FOLVITE) 315 MCG tablet Take 400 mcg by mouth 2 (two) times daily.     . hydrochlorothiazide (HYDRODIURIL) 25 MG tablet Take 25 mg by mouth every morning.    01/05/2018 at Unknown time  . losartan (COZAAR) 100 MG tablet Take 100 mg by mouth daily. In am.   01/05/2018 at Unknown time  . MAGNESIUM OXIDE 400 PO Take by mouth daily.     Marland Kitchen alendronate (FOSAMAX) 70 MG tablet Take 70 mg by mouth once a week.  6 Taking  . aspirin EC 81 MG tablet Take 81 mg by mouth daily.   Taking  . Calcium Carbonate-Vitamin D 600-400 MG-UNIT tablet Take by mouth. Take 1 tablet by mouth 2 (two) times daily with meals   Taking  . Cholecalciferol (VITAMIN D3) 50000 units CAPS Take 1 capsule by mouth once a week.  0 Taking  . Coenzyme Q10 (CO Q-10) 200 MG CAPS Take 200 mg by mouth daily.   Not Taking  . Cyanocobalamin (VITAMIN B 12 PO) Take 2,500 mcg by mouth 1 day or 1 dose.   Not Taking  . ferrous sulfate 325 (65 FE) MG tablet Take 325 mg by mouth daily.   Taking  . insulin  detemir (LEVEMIR) 100 UNIT/ML injection Inject 30-45 Units into the skin 2 (two) times daily. 30 units in the morning before breakfast & 45 units in the evening before supper   Not Taking  . Insulin Glargine (LANTUS SOLOSTAR) 100 UNIT/ML Solostar Pen Inject 35 units under the skin in the morning and 45 units under the skin in the evening.   Taking  . insulin lispro (HUMALOG) 100 UNIT/ML injection Inject 25 Units into the skin 3 (three) times daily before meals.   Taking  . Krill Oil 350 MG CAPS Take 350 mg by mouth daily at 3 pm.   Not Taking  . Menthol, Topical Analgesic, (BIOFREEZE ROLL-ON EX) Apply 1 application topically 4 (four) times daily as needed (for pain.).   Taking  . metFORMIN (GLUCOPHAGE) 1000 MG tablet Take 1,000 mg by mouth 2 (two) times daily after a meal.    Taking  . Multiple Vitamins-Minerals (PRESERVISION AREDS 2 PO) Take 1 tablet by mouth daily at 3 pm.   Taking  . nicotine polacrilex (NICORETTE) 2 MG gum Take by mouth. Take 2 mg by mouth as needed for Smoking cessation Chew the gum until it tingles and then park it between your cheek and gum. Repeat   Taking  . ondansetron (  ZOFRAN) 4 MG tablet Take 1 tablet (4 mg total) by mouth every 8 (eight) hours as needed for nausea or vomiting. (Patient not taking: Reported on 12/08/2017) 30 tablet 0 Not Taking  . oxyCODONE (OXY IR/ROXICODONE) 5 MG immediate release tablet Take 1 tablet (5 mg total) by mouth every 6 (six) hours as needed for severe pain. (Patient not taking: Reported on 12/08/2017) 40 tablet 0 Not Taking  . Pyridoxine HCl (VITAMIN B-6 PO) Take by mouth.   Taking     No Known Allergies   Past Medical History:  Diagnosis Date  . Anemia   . Arthritis   . Carotid disease, bilateral ([place])   . Diabetes mellitus without complication ([place])   . Diabetic neuropathy ([place])   . Diabetic retinopathy ([place])   . Dyspnea   . Hyperlipidemia   . Hypertension   . Hypothyroidism   . Microalbuminuria   . Onychomycosis   . Personal  history of tobacco use, presenting hazards to health 10/10/2014  . Protein in urine   . Pyloric ulcer associated with Helicobacter pylori   . Spinal stenosis   . Thyroid disease 1980's   treated with radioactive iodine  . Tremors of nervous system     Review of systems:  Otherwise negative.    Physical Exam  Gen: Alert, oriented. Appears stated age.  HEENT: [place]/AT. PERRLA. Lungs: CTA, no wheezes. CV: RR nl S1, S2. Abd: soft, benign, no masses. BS+ Ext: No edema. Pulses 2+    Planned procedures: Proceed with EGD and colonoscopy. The patient understands the nature of the planned procedure, indications, risks, alternatives and potential complications including but not limited to bleeding, infection, perforation, damage to internal organs and possible oversedation/side effects from anesthesia. The patient agrees and gives consent to proceed.  Please refer to procedure notes for findings, recommendations and patient disposition/instructions.     Julius Boniface K. [name], M.D. Gastroenterology 01/05/2018  9:33 AM

## 2018-01-05 NOTE — Anesthesia Preprocedure Evaluation (Signed)
Anesthesia Evaluation  Patient identified by MRN, date of birth, ID band Patient awake    Reviewed: Allergy & Precautions, H&P , NPO status , Patient's Chart, lab work & pertinent test results, reviewed documented beta blocker date and time   History of Anesthesia Complications Negative for: history of anesthetic complications  Airway Mallampati: I  TM Distance: >3 FB Neck ROM: full    Dental no notable dental hx. (+) Chipped, Dental Advidsory Given, Poor Dentition, Missing   Pulmonary shortness of breath and with exertion, neg sleep apnea, neg COPD, neg recent URI, Current Smoker,           Cardiovascular Exercise Tolerance: Good hypertension, Pt. on medications (-) angina+ CAD  (-) Past MI, (-) Cardiac Stents and (-) CABG (-) dysrhythmias (-) Valvular Problems/Murmurs     Neuro/Psych negative neurological ROS  negative psych ROS   GI/Hepatic negative GI ROS, Neg liver ROS, PUD,   Endo/Other  diabetes, Type 2  Renal/GU negative Renal ROS  negative genitourinary   Musculoskeletal   Abdominal   Peds  Hematology negative hematology ROS (+) Blood dyscrasia, anemia ,   Anesthesia Other Findings Past Medical History: No date: Anemia No date: Arthritis No date: Carotid disease, bilateral (HCC) No date: Diabetes mellitus without complication (HCC) No date: Diabetic neuropathy ([place]) No date: Diabetic retinopathy ([place]) No date: Dyspnea No date: Hyperlipidemia No date: Hypertension No date: Hypothyroidism No date: Microalbuminuria No date: Onychomycosis 10/10/2014: Personal history of tobacco use, presenting hazards to  health No date: Protein in urine No date: Pyloric ulcer associated with Helicobacter pylori No date: Spinal stenosis 1980's: Thyroid disease     Comment:  treated with radioactive iodine No date: Tremors of nervous system   Reproductive/Obstetrics negative OB ROS                              Anesthesia Physical  Anesthesia Plan  ASA: III  Anesthesia Plan: General   Post-op Pain Management:    Induction: Intravenous  PONV Risk Score and Plan: 2 and Propofol infusion and TIVA  Airway Management Planned: Natural Airway and Nasal Cannula  Additional Equipment:   Intra-op Plan:   Post-operative Plan:   Informed Consent: I have reviewed the patients History and Physical, chart, labs and discussed the procedure including the risks, benefits and alternatives for the proposed anesthesia with the patient or authorized representative who has indicated his/her understanding and acceptance.   Dental Advisory Given  Plan Discussed with: CRNA  Anesthesia Plan Comments:         Anesthesia Quick Evaluation

## 2018-01-06 ENCOUNTER — Inpatient Hospital Stay: Payer: Medicare Other

## 2018-01-06 ENCOUNTER — Encounter: Payer: Self-pay | Admitting: Internal Medicine

## 2018-01-06 VITALS — BP 137/71 | HR 64 | Temp 97.6°F | Resp 20

## 2018-01-06 DIAGNOSIS — D509 Iron deficiency anemia, unspecified: Secondary | ICD-10-CM | POA: Diagnosis not present

## 2018-01-06 LAB — SURGICAL PATHOLOGY

## 2018-01-06 MED ORDER — IRON SUCROSE 20 MG/ML IV SOLN
200.0000 mg | Freq: Once | INTRAVENOUS | Status: AC
  Administered 2018-01-06: 200 mg via INTRAVENOUS
  Filled 2018-01-06: qty 10

## 2018-01-06 MED ORDER — SODIUM CHLORIDE 0.9 % IV SOLN
Freq: Once | INTRAVENOUS | Status: AC
  Administered 2018-01-06: 14:00:00 via INTRAVENOUS
  Filled 2018-01-06: qty 250

## 2018-02-02 ENCOUNTER — Inpatient Hospital Stay: Payer: Medicare Other | Attending: Oncology

## 2018-02-02 DIAGNOSIS — D509 Iron deficiency anemia, unspecified: Secondary | ICD-10-CM | POA: Insufficient documentation

## 2018-02-02 DIAGNOSIS — E039 Hypothyroidism, unspecified: Secondary | ICD-10-CM | POA: Diagnosis not present

## 2018-02-02 DIAGNOSIS — E785 Hyperlipidemia, unspecified: Secondary | ICD-10-CM | POA: Insufficient documentation

## 2018-02-02 DIAGNOSIS — E1140 Type 2 diabetes mellitus with diabetic neuropathy, unspecified: Secondary | ICD-10-CM | POA: Diagnosis not present

## 2018-02-02 DIAGNOSIS — Z7982 Long term (current) use of aspirin: Secondary | ICD-10-CM | POA: Insufficient documentation

## 2018-02-02 DIAGNOSIS — K449 Diaphragmatic hernia without obstruction or gangrene: Secondary | ICD-10-CM | POA: Insufficient documentation

## 2018-02-02 DIAGNOSIS — I10 Essential (primary) hypertension: Secondary | ICD-10-CM | POA: Diagnosis not present

## 2018-02-02 DIAGNOSIS — Z794 Long term (current) use of insulin: Secondary | ICD-10-CM | POA: Diagnosis not present

## 2018-02-02 DIAGNOSIS — F17210 Nicotine dependence, cigarettes, uncomplicated: Secondary | ICD-10-CM | POA: Insufficient documentation

## 2018-02-02 DIAGNOSIS — Z79899 Other long term (current) drug therapy: Secondary | ICD-10-CM | POA: Insufficient documentation

## 2018-02-02 LAB — FERRITIN: FERRITIN: 65 ng/mL (ref 11–307)

## 2018-02-02 LAB — CBC WITH DIFFERENTIAL/PLATELET
Abs Immature Granulocytes: 0.02 10*3/uL (ref 0.00–0.07)
Basophils Absolute: 0.1 10*3/uL (ref 0.0–0.1)
Basophils Relative: 1 %
EOS ABS: 0.1 10*3/uL (ref 0.0–0.5)
Eosinophils Relative: 2 %
HCT: 36.5 % (ref 36.0–46.0)
HEMOGLOBIN: 10.9 g/dL — AB (ref 12.0–15.0)
IMMATURE GRANULOCYTES: 0 %
LYMPHS PCT: 38 %
Lymphs Abs: 2.8 10*3/uL (ref 0.7–4.0)
MCH: 25.2 pg — AB (ref 26.0–34.0)
MCHC: 29.9 g/dL — AB (ref 30.0–36.0)
MCV: 84.3 fL (ref 80.0–100.0)
MONOS PCT: 7 %
Monocytes Absolute: 0.5 10*3/uL (ref 0.1–1.0)
NEUTROS PCT: 52 %
Neutro Abs: 3.8 10*3/uL (ref 1.7–7.7)
PLATELETS: 276 10*3/uL (ref 150–400)
RBC: 4.33 MIL/uL (ref 3.87–5.11)
RDW: 22.3 % — ABNORMAL HIGH (ref 11.5–15.5)
WBC: 7.2 10*3/uL (ref 4.0–10.5)
nRBC: 0 % (ref 0.0–0.2)

## 2018-02-02 LAB — IRON AND TIBC
Iron: 58 ug/dL (ref 28–170)
SATURATION RATIOS: 16 % (ref 10.4–31.8)
TIBC: 368 ug/dL (ref 250–450)
UIBC: 310 ug/dL

## 2018-02-04 ENCOUNTER — Inpatient Hospital Stay: Payer: Medicare Other

## 2018-02-04 ENCOUNTER — Encounter: Payer: Self-pay | Admitting: Oncology

## 2018-02-04 ENCOUNTER — Inpatient Hospital Stay (HOSPITAL_BASED_OUTPATIENT_CLINIC_OR_DEPARTMENT_OTHER): Payer: Medicare Other | Admitting: Oncology

## 2018-02-04 ENCOUNTER — Other Ambulatory Visit: Payer: Self-pay

## 2018-02-04 VITALS — BP 140/68 | HR 70 | Temp 97.3°F | Resp 18 | Wt 152.4 lb

## 2018-02-04 VITALS — BP 156/66 | HR 72 | Resp 18

## 2018-02-04 DIAGNOSIS — D509 Iron deficiency anemia, unspecified: Secondary | ICD-10-CM

## 2018-02-04 DIAGNOSIS — Z79899 Other long term (current) drug therapy: Secondary | ICD-10-CM

## 2018-02-04 DIAGNOSIS — F17210 Nicotine dependence, cigarettes, uncomplicated: Secondary | ICD-10-CM

## 2018-02-04 MED ORDER — IRON SUCROSE 20 MG/ML IV SOLN
200.0000 mg | Freq: Once | INTRAVENOUS | Status: AC
  Administered 2018-02-04: 200 mg via INTRAVENOUS
  Filled 2018-02-04: qty 10

## 2018-02-04 MED ORDER — SODIUM CHLORIDE 0.9 % IV SOLN
INTRAVENOUS | Status: DC
  Administered 2018-02-04: 14:00:00 via INTRAVENOUS
  Filled 2018-02-04: qty 250

## 2018-02-04 NOTE — Progress Notes (Signed)
Hematology/Oncology Consult note [facility] [phone] [phone]   Patient Care Team: Perrin Maltese, MD as PCP - General (Internal Medicine)  REFERRING PROVIDER: Perrin Maltese, MD CHIEF COMPLAINTS/REASON FOR VISIT:  Evaluation of iron deficiency anemia  HISTORY OF PRESENTING ILLNESS:  Hannah Sullivan is a  67 y.o.  female with PMH listed below who was referred to me for evaluation of iron deficiency anemia Patient follows up with primary care physician Dr. Yancey Flemings recently had labs done on 12/01/2017 at [facility] clinic. Labs reviewed.  CBC showed hemoglobin 7.6, hematocrit 27, MCV 76.5, WBC 10.8, platelet count 425.  Differential is normal CMP showed normal creatinine and bilirubin level. 11/18/2017 iron panel showed TIBC 537, saturation 2, 11/18/2017 ferritin 7. History of iron deficiency: She recalls remotely when she was pregnant she used to take iron supplementation.  Currently she takes oral iron supplementation since she got her blood results. Rectal bleeding: Denies reports that she has dark stool after taking oral iron supplementation. Menstrual bleeding/ Vaginal bleeding : Denies Hematemesis or hemoptysis : denies Blood in urine : denies  Pica: Denies Last endoscopy: She [place] in [place] [facility] Fatigue: reports worsening fatigue. Chronic onset, perisistent, no aggravating or improving factors, no associated symptoms.  SOB: denies Denies weight loss, fever or chills, abdominal pain, chest pain.  INTERVAL HISTORY Hannah Sullivan is a 66 y.o. female who has above history reviewed by me today presents for follow up visit for management of iron deficiency anemia.  S/p IV venofer weekly x 4.  Reports feeling better, fatigue has improved.  During the interval, patient has had underwent colonoscopy and upper endoscopy.  01/05/2018 Upper endoscopy showed gastritis, hiatal hernia, no active bleeding source with  discomfort.  Colonoscopy showed small polyps in the sigmoid colon, descending colon and transverse colon.  Resected and retrieved.  Nonbleeding internal hemorrhoids.  Otherwise normal. Pathology negative for malignancy.   Review of Systems  Constitutional: Positive for malaise/fatigue. Negative for chills, fever and weight loss.  HENT: Negative for nosebleeds and sore throat.   Eyes: Negative for double vision, photophobia and redness.  Respiratory: Negative for cough, shortness of breath and wheezing.   Cardiovascular: Negative for chest pain, palpitations and orthopnea.  Gastrointestinal: Positive for constipation. Negative for abdominal pain, blood in stool, nausea and vomiting.  Genitourinary: Negative for dysuria.  Musculoskeletal: Negative for back pain, myalgias and neck pain.  Skin: Negative for itching and rash.  Neurological: Negative for dizziness, tingling and tremors.  Endo/Heme/Allergies: Negative for environmental allergies. Does not bruise/bleed easily.  Psychiatric/Behavioral: Negative for depression.    MEDICAL HISTORY:  Past Medical History:  Diagnosis Date  . Anemia   . Arthritis   . Carotid disease, bilateral ([place])   . Diabetes mellitus without complication ([place])   . Diabetic neuropathy ([place])   . Diabetic retinopathy ([place])   . Dyspnea   . Hyperlipidemia   . Hypertension   . Hypothyroidism   . Microalbuminuria   . Onychomycosis   . Personal history of tobacco use, presenting hazards to health 10/10/2014  . Protein in urine   . Pyloric ulcer associated with Helicobacter pylori   . Spinal stenosis   . Thyroid disease 1980's   treated with radioactive iodine  . Tremors of nervous system     SURGICAL HISTORY: Past Surgical History:  Procedure Laterality Date  . ABDOMINAL HYSTERECTOMY  1196   Dr [facility], [place], [place] Dr Shana Chute  . CESAREAN SECTION  1971/1978  2 c-sections, Dr [facility], [place], [place]  . COLONOSCOPY WITH PROPOFOL N/A 01/05/2018    Procedure: COLONOSCOPY WITH PROPOFOL;  Surgeon: Toledo, Benay Pike, MD;  Location: [facility] ENDOSCOPY;  Service: Gastroenterology;  Laterality: N/A;  . DOPPLER ECHOCARDIOGRAPHY  02/2012   Left Corptid Artery-neck, Dr Chryl [facility]  . endoscope  2009   Dr Maceo Pro, [facility]  . ESOPHAGOGASTRODUODENOSCOPY (EGD) WITH PROPOFOL N/A 01/05/2018   Procedure: ESOPHAGOGASTRODUODENOSCOPY (EGD) WITH PROPOFOL;  Surgeon: Toledo, Benay Pike, MD;  Location: [facility] ENDOSCOPY;  Service: Gastroenterology;  Laterality: N/A;  . SHOULDER ARTHROSCOPY WITH ROTATOR CUFF REPAIR Right 10/20/2016   Procedure: SHOULDER ARTHROSCOPY WITH OPEN ROTATOR CUFF REPAIR;  Surgeon: Thornton Park, MD;  Location: [facility] ORS;  Service: Orthopedics;  Laterality: Right;  . TONSILECTOMY/ADENOIDECTOMY WITH MYRINGOTOMY  1961  . TONSILLECTOMY    . [place]    SOCIAL HISTORY: Social History   Socioeconomic History  . Marital status: Widowed    Spouse name: Not on file  . Number of children: Not on file  . Years of education: Not on file  . Highest education level: Not on file  Occupational History  . Not on file  Social Needs  . Financial resource strain: Not on file  . Food insecurity:    Worry: Not on file    Inability: Not on file  . Transportation needs:    Medical: Not on file    Non-medical: Not on file  Tobacco Use  . Smoking status: Current Every Day Smoker    Packs/day: 0.50    Years: 45.00    Pack years: 22.50  . Smokeless tobacco: Never Used  . Tobacco comment: currently smoking 3-4 cigarretes a day  Substance and Sexual Activity  . Alcohol use: No    Comment: occasionally  . Drug use: No  . Sexual activity: Not on file  Lifestyle  . Physical activity:    Days per week: Not on file    Minutes per session: Not on file  . Stress: Not on file  Relationships  . Social connections:    Talks on phone: Not on file    Gets together: Not on  file    Attends religious service: Not on file    Active member of club or organization: Not on file    Attends meetings of clubs or organizations: Not on file    Relationship status: Not on file  . Intimate partner violence:    Fear of current or ex partner: Not on file    Emotionally abused: Not on file    Physically abused: Not on file    Forced sexual activity: Not on file  Other Topics Concern  . Not on file  Social History Narrative  . Not on file    FAMILY HISTORY: Family History  Problem Relation Age of Onset  . Osteoporosis Mother   . Heart attack Sister   . Diabetes Sister   . Diabetes Father   . Heart attack Sister   . Stroke Sister   . Deep vein thrombosis Sister   . Breast cancer Neg Hx     ALLERGIES:  has No Known Allergies.  MEDICATIONS:  Current Outpatient Medications  Medication Sig Dispense Refill  . aspirin EC 81 MG tablet Take 81 mg by mouth daily.    Marland Kitchen atorvastatin (LIPITOR) 40 MG tablet Take 40 mg by mouth daily at 6 PM.     . Calcium Carbonate-Vitamin  D 600-400 MG-UNIT tablet Take by mouth. Take 1 tablet by mouth 2 (two) times daily with meals    . Cholecalciferol (VITAMIN D3) 50000 units CAPS Take 1 capsule by mouth once a week.  0  . Coenzyme Q10 (CO Q-10) 200 MG CAPS Take 200 mg by mouth daily.    . Cyanocobalamin (VITAMIN B 12 PO) Take 2,500 mcg by mouth 1 day or 1 dose.    . diltiazem (CARDIZEM CD) 360 MG 24 hr capsule Take 360 mg by mouth daily. In am.    . ferrous sulfate 325 (65 FE) MG tablet Take 325 mg by mouth daily.    . folic acid (FOLVITE) 258 MCG tablet Take 400 mcg by mouth 2 (two) times daily.    . hydrochlorothiazide (HYDRODIURIL) 25 MG tablet Take 25 mg by mouth every morning.     . insulin detemir (LEVEMIR) 100 UNIT/ML injection Inject 30-45 Units into the skin 2 (two) times daily. 30 units in the morning before breakfast & 45 units in the evening before supper    . insulin lispro (HUMALOG) 100 UNIT/ML injection Inject 25 Units  into the skin 3 (three) times daily before meals.    Marland Kitchen losartan (COZAAR) 100 MG tablet Take 100 mg by mouth daily. In am.    . Menthol, Topical Analgesic, (BIOFREEZE ROLL-ON EX) Apply 1 application topically 4 (four) times daily as needed (for pain.).    Marland Kitchen metFORMIN (GLUCOPHAGE) 1000 MG tablet Take 1,000 mg by mouth 2 (two) times daily after a meal.     . Multiple Vitamins-Minerals (PRESERVISION AREDS 2 PO) Take 1 tablet by mouth daily at 3 pm.    . nicotine polacrilex (NICORETTE) 2 MG gum Take by mouth. Take 2 mg by mouth as needed for Smoking cessation Chew the gum until it tingles and then park it between your cheek and gum. Repeat    . Pyridoxine HCl (VITAMIN B-6 PO) Take by mouth.    Marland Kitchen alendronate (FOSAMAX) 70 MG tablet Take 70 mg by mouth once a week.  6  . ergocalciferol (VITAMIN D2) 50000 units capsule Take 50,000 Units by mouth once a week.    . Insulin Glargine (LANTUS SOLOSTAR) 100 UNIT/ML Solostar Pen Inject 35 units under the skin in the morning and 45 units under the skin in the evening.    Javier Docker Oil 350 MG CAPS Take 350 mg by mouth daily at 3 pm.    . MAGNESIUM OXIDE 400 PO Take by mouth daily.    . ondansetron (ZOFRAN) 4 MG tablet Take 1 tablet (4 mg total) by mouth every 8 (eight) hours as needed for nausea or vomiting. (Patient not taking: Reported on 02/04/2018) 30 tablet 0  . oxyCODONE (OXY IR/ROXICODONE) 5 MG immediate release tablet Take 1 tablet (5 mg total) by mouth every 6 (six) hours as needed for severe pain. (Patient not taking: Reported on 12/08/2017) 40 tablet 0   No current facility-administered medications for this visit.      PHYSICAL EXAMINATION: ECOG PERFORMANCE STATUS: 1 - Symptomatic but completely ambulatory Vitals:   02/04/18 1315  BP: 140/68  Pulse: 70  Resp: 18  Temp: (!) 97.3 F (36.3 C)   Filed Weights   02/04/18 1315  Weight: 152 lb 6.4 oz (69.1 kg)    Physical Exam  Constitutional: She is oriented to person, place, and time. No  distress.  HENT:  Head: Normocephalic and atraumatic.  Mouth/Throat: Oropharynx is clear and moist.  Eyes: Pupils are equal,  round, and reactive to light. EOM are normal. No scleral icterus.  Neck: Normal range of motion. Neck supple.  Cardiovascular: Normal rate, regular rhythm and normal heart sounds.  Pulmonary/Chest: Effort normal. No respiratory distress. She has no wheezes.  Abdominal: Soft. Bowel sounds are normal. She exhibits no distension and no mass. There is no tenderness.  Musculoskeletal: Normal range of motion. She exhibits no edema or deformity.  Neurological: She is alert and oriented to person, place, and time. No cranial nerve deficit. Coordination normal.  Chronic head tremor  Skin: Skin is warm and dry. No rash noted. No erythema.  Psychiatric: She has a normal mood and affect. Her behavior is normal. Thought content normal.     LABORATORY DATA:  I have reviewed the data as listed Lab Results  Component Value Date   WBC 7.2 02/02/2018   HGB 10.9 (L) 02/02/2018   HCT 36.5 02/02/2018   MCV 84.3 02/02/2018   PLT 276 02/02/2018   No results for input(s): NA, K, CL, CO2, GLUCOSE, BUN, CREATININE, CALCIUM, GFRNONAA, GFRAA, PROT, ALBUMIN, AST, ALT, ALKPHOS, BILITOT, BILIDIR, IBILI in the last 8760 hours. Iron/TIBC/Ferritin/ %Sat    Component Value Date/Time   IRON 58 02/02/2018 1125   TIBC 368 02/02/2018 1125   FERRITIN 65 02/02/2018 1125   IRONPCTSAT 16 02/02/2018 1125        ASSESSMENT & PLAN:  1. Iron deficiency anemia, unspecified iron deficiency anemia type    Lab reviewed and discussed with patient.  Hemoglobin has improved from 7.6 to 10.9, ferritin improved from 7 to 65.  Will proceed with one more dose of Venofer today.  GI work up includes upper endoscopy and colonoscopy did not reveal any bleeding source. She has had capsule study done on 01/27/2018.awaiting results. Continue follow up with GI.   Orders Placed This Encounter  Procedures  .  Iron and TIBC    Standing Status:   Future    Standing Expiration Date:   02/05/2019  . CBC with Differential/Platelet    Standing Status:   Future    Standing Expiration Date:   02/05/2019  . Ferritin    Standing Status:   Future    Standing Expiration Date:   02/05/2019    All questions were answered. The patient knows to call the clinic with any problems questions or concerns.  Total face to face encounter time for this patient visit was 15 min. >50% of the time was  spent in counseling and coordination of care.  Earlie Server, MD, PhD Hematology Oncology [facility] at [facility] Pager- [phone] 02/04/2018

## 2018-02-04 NOTE — Progress Notes (Signed)
Patient here for follow up. Pt complains of mild of constipation.

## 2018-04-05 ENCOUNTER — Other Ambulatory Visit: Payer: Self-pay | Admitting: Family

## 2018-04-05 DIAGNOSIS — Z1231 Encounter for screening mammogram for malignant neoplasm of breast: Secondary | ICD-10-CM

## 2018-04-07 DIAGNOSIS — K5520 Angiodysplasia of colon without hemorrhage: Secondary | ICD-10-CM | POA: Insufficient documentation

## 2018-04-07 DIAGNOSIS — D369 Benign neoplasm, unspecified site: Secondary | ICD-10-CM | POA: Insufficient documentation

## 2018-05-04 ENCOUNTER — Inpatient Hospital Stay: Payer: Medicare Other | Attending: Oncology

## 2018-05-04 DIAGNOSIS — D500 Iron deficiency anemia secondary to blood loss (chronic): Secondary | ICD-10-CM | POA: Diagnosis present

## 2018-05-04 DIAGNOSIS — K648 Other hemorrhoids: Secondary | ICD-10-CM | POA: Insufficient documentation

## 2018-05-04 DIAGNOSIS — D509 Iron deficiency anemia, unspecified: Secondary | ICD-10-CM

## 2018-05-04 DIAGNOSIS — E785 Hyperlipidemia, unspecified: Secondary | ICD-10-CM | POA: Diagnosis not present

## 2018-05-04 DIAGNOSIS — M1990 Unspecified osteoarthritis, unspecified site: Secondary | ICD-10-CM | POA: Insufficient documentation

## 2018-05-04 DIAGNOSIS — Q2730 Arteriovenous malformation, site unspecified: Secondary | ICD-10-CM | POA: Diagnosis not present

## 2018-05-04 DIAGNOSIS — Z7982 Long term (current) use of aspirin: Secondary | ICD-10-CM | POA: Insufficient documentation

## 2018-05-04 DIAGNOSIS — I10 Essential (primary) hypertension: Secondary | ICD-10-CM | POA: Diagnosis not present

## 2018-05-04 DIAGNOSIS — Z79899 Other long term (current) drug therapy: Secondary | ICD-10-CM | POA: Insufficient documentation

## 2018-05-04 DIAGNOSIS — E039 Hypothyroidism, unspecified: Secondary | ICD-10-CM | POA: Diagnosis not present

## 2018-05-04 DIAGNOSIS — E1140 Type 2 diabetes mellitus with diabetic neuropathy, unspecified: Secondary | ICD-10-CM | POA: Insufficient documentation

## 2018-05-04 DIAGNOSIS — Z794 Long term (current) use of insulin: Secondary | ICD-10-CM | POA: Insufficient documentation

## 2018-05-04 DIAGNOSIS — F17210 Nicotine dependence, cigarettes, uncomplicated: Secondary | ICD-10-CM | POA: Diagnosis not present

## 2018-05-04 LAB — CBC WITH DIFFERENTIAL/PLATELET
Abs Immature Granulocytes: 0.02 10*3/uL (ref 0.00–0.07)
Basophils Absolute: 0.1 10*3/uL (ref 0.0–0.1)
Basophils Relative: 1 %
EOS ABS: 0.2 10*3/uL (ref 0.0–0.5)
EOS PCT: 2 %
HEMATOCRIT: 33.4 % — AB (ref 36.0–46.0)
HEMOGLOBIN: 10.1 g/dL — AB (ref 12.0–15.0)
Immature Granulocytes: 0 %
LYMPHS PCT: 28 %
Lymphs Abs: 2.7 10*3/uL (ref 0.7–4.0)
MCH: 27.1 pg (ref 26.0–34.0)
MCHC: 30.2 g/dL (ref 30.0–36.0)
MCV: 89.5 fL (ref 80.0–100.0)
MONO ABS: 0.8 10*3/uL (ref 0.1–1.0)
Monocytes Relative: 8 %
Neutro Abs: 6.1 10*3/uL (ref 1.7–7.7)
Neutrophils Relative %: 61 %
Platelets: 321 10*3/uL (ref 150–400)
RBC: 3.73 MIL/uL — AB (ref 3.87–5.11)
RDW: 17.1 % — AB (ref 11.5–15.5)
WBC: 9.9 10*3/uL (ref 4.0–10.5)
nRBC: 0 % (ref 0.0–0.2)

## 2018-05-04 LAB — IRON AND TIBC
IRON: 65 ug/dL (ref 28–170)
SATURATION RATIOS: 15 % (ref 10.4–31.8)
TIBC: 428 ug/dL (ref 250–450)
UIBC: 363 ug/dL

## 2018-05-04 LAB — FERRITIN: Ferritin: 23 ng/mL (ref 11–307)

## 2018-05-06 ENCOUNTER — Inpatient Hospital Stay: Payer: Medicare Other

## 2018-05-06 ENCOUNTER — Other Ambulatory Visit: Payer: Self-pay

## 2018-05-06 ENCOUNTER — Encounter: Payer: Self-pay | Admitting: Oncology

## 2018-05-06 ENCOUNTER — Inpatient Hospital Stay (HOSPITAL_BASED_OUTPATIENT_CLINIC_OR_DEPARTMENT_OTHER): Payer: Medicare Other | Admitting: Oncology

## 2018-05-06 VITALS — BP 136/63 | HR 68 | Temp 97.7°F | Resp 18 | Wt 147.8 lb

## 2018-05-06 DIAGNOSIS — D500 Iron deficiency anemia secondary to blood loss (chronic): Secondary | ICD-10-CM | POA: Diagnosis not present

## 2018-05-06 DIAGNOSIS — Q2730 Arteriovenous malformation, site unspecified: Secondary | ICD-10-CM

## 2018-05-06 DIAGNOSIS — Z79899 Other long term (current) drug therapy: Secondary | ICD-10-CM

## 2018-05-06 DIAGNOSIS — K5520 Angiodysplasia of colon without hemorrhage: Secondary | ICD-10-CM

## 2018-05-06 NOTE — Progress Notes (Addendum)
Hematology/Oncology follow  up note [facility] [phone] [phone]   Patient Care Team: Perrin Maltese, MD as PCP - General (Internal Medicine)  REFERRING PROVIDER: Perrin Maltese, MD REASON FOR VISIT:  Follow up for  iron deficiency anemia  HISTORY OF PRESENTING ILLNESS:  Hannah Sullivan is a  68 y.o.  female with PMH listed below who was referred to me for evaluation of iron deficiency anemia Patient follows up with primary care physician Dr. Yancey Flemings recently had labs done on 12/01/2017 at [facility] clinic. Labs reviewed.  CBC showed hemoglobin 7.6, hematocrit 27, MCV 76.5, WBC 10.8, platelet count 425.  Differential is normal CMP showed normal creatinine and bilirubin level. 11/18/2017 iron panel showed TIBC 537, saturation 2, 11/18/2017 ferritin 7. History of iron deficiency: She recalls remotely when she was pregnant she used to take iron supplementation.  Currently she takes oral iron supplementation since she got her blood results. Rectal bleeding: Denies reports that she has dark stool after taking oral iron supplementation. Menstrual bleeding/ Vaginal bleeding : Denies Hematemesis or hemoptysis : denies Blood in urine : denies  Pica: Denies Last endoscopy: She had a  Fatigue: reports worsening fatigue. Chronic onset, perisistent, no aggravating or improving factors, no associated symptoms.  SOB: denies Denies weight loss, fever or chills, abdominal pain, chest pain.  #GI work-up includes colonoscopy, endoscopy  colonoscopy in 2011 in [place] 01/05/2018 Dr.Toledo.  Upper endoscopy showed gastritis, hiatal hernia, no active bleeding source with discomfort.  Colonoscopy showed small polyps in the sigmoid colon, descending colon and transverse colon.  Resected and retrieved.  Nonbleeding internal hemorrhoids.  Otherwise normal. Pathology negative for malignancy.  INTERVAL HISTORY Hannah Sullivan is a 68 y.o. female who has above  history reviewed by me today presents for follow up visit for management of iron deficiency anemia.  She has received IV Venofer previously. Chronic fatigue has not worsened.  She spent some time in [place] and symptoms to weigh in December 2019.   Patient has had upper and lower endoscopy done in October 2019. 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 Denies any black stool, abdominal pain, chest pain, shortness of breath. She was seen by [facility] on 04/07/2018.  Recommend to close monitor CBC.   Review of Systems  Constitutional: Positive for malaise/fatigue. Negative for chills, fever and weight loss.  HENT: Negative for nosebleeds and sore throat.   Eyes: Negative for double vision, photophobia and redness.  Respiratory: Negative for cough, shortness of breath and wheezing.   Cardiovascular: Negative for chest pain, palpitations, orthopnea and leg swelling.  Gastrointestinal: Positive for constipation. Negative for abdominal pain, blood in stool, nausea and vomiting.  Genitourinary: Negative for dysuria.  Musculoskeletal: Negative for back pain, myalgias and neck pain.  Skin: Negative for itching and rash.  Neurological: Negative for dizziness, tingling and tremors.  Endo/Heme/Allergies: Negative for environmental allergies. Does not bruise/bleed easily.  Psychiatric/Behavioral: Negative for depression and hallucinations.    MEDICAL HISTORY:  Past Medical History:  Diagnosis Date  . Anemia   . Arthritis   . Carotid disease, bilateral ([place])   . Diabetes mellitus without complication ([place])   . Diabetic neuropathy ([place])   . Diabetic retinopathy ([place])   . Dyspnea   . Hyperlipidemia   . Hypertension   . Hypothyroidism   . Microalbuminuria   . Onychomycosis   . Personal history of tobacco use, presenting hazards to health 10/10/2014  .  Protein in urine   . Pyloric ulcer associated with Helicobacter pylori   .  Spinal stenosis   . Thyroid disease 1980's   treated with radioactive iodine  . Tremors of nervous system     SURGICAL HISTORY: Past Surgical History:  Procedure Laterality Date  . ABDOMINAL HYSTERECTOMY  1196   Dr [facility], [place], [place] Dr Shana Chute  . CESAREAN SECTION  1971/1978   2 c-sections, Dr [facility], [place], [place]  . COLONOSCOPY WITH PROPOFOL N/A 01/05/2018   Procedure: COLONOSCOPY WITH PROPOFOL;  Surgeon: Toledo, Benay Pike, MD;  Location: [facility] ENDOSCOPY;  Service: Gastroenterology;  Laterality: N/A;  . DOPPLER ECHOCARDIOGRAPHY  02/2012   Left Corptid Artery-neck, Dr Chryl Heck [facility]  . endoscope  2009   Dr Maceo Pro, [facility]  . ESOPHAGOGASTRODUODENOSCOPY (EGD) WITH PROPOFOL N/A 01/05/2018   Procedure: ESOPHAGOGASTRODUODENOSCOPY (EGD) WITH PROPOFOL;  Surgeon: Toledo, Benay Pike, MD;  Location: [facility] ENDOSCOPY;  Service: Gastroenterology;  Laterality: N/A;  . SHOULDER ARTHROSCOPY WITH ROTATOR CUFF REPAIR Right 10/20/2016   Procedure: SHOULDER ARTHROSCOPY WITH OPEN ROTATOR CUFF REPAIR;  Surgeon: Thornton Park, MD;  Location: [facility] ORS;  Service: Orthopedics;  Laterality: Right;  . TONSILECTOMY/ADENOIDECTOMY WITH MYRINGOTOMY  1961  . TONSILLECTOMY    . [place]    SOCIAL HISTORY: Social History   Socioeconomic History  . Marital status: Widowed    Spouse name: Not on file  . Number of children: Not on file  . Years of education: Not on file  . Highest education level: Not on file  Occupational History  . Not on file  Social Needs  . Financial resource strain: Not on file  . Food insecurity:    Worry: Not on file    Inability: Not on file  . Transportation needs:    Medical: Not on file    Non-medical: Not on file  Tobacco Use  . Smoking status: Current Every Day Smoker    Packs/day: 0.50    Years: 45.00    Pack years: 22.50  . Smokeless tobacco: Never Used  . Tobacco comment:  currently smoking 3-4 cigarretes a day  Substance and Sexual Activity  . Alcohol use: No    Comment: occasionally  . Drug use: No  . Sexual activity: Not on file  Lifestyle  . Physical activity:    Days per week: Not on file    Minutes per session: Not on file  . Stress: Not on file  Relationships  . Social connections:    Talks on phone: Not on file    Gets together: Not on file    Attends religious service: Not on file    Active member of club or organization: Not on file    Attends meetings of clubs or organizations: Not on file    Relationship status: Not on file  . Intimate partner violence:    Fear of current or ex partner: Not on file    Emotionally abused: Not on file    Physically abused: Not on file    Forced sexual activity: Not on file  Other Topics Concern  . Not on file  Social History Narrative  . Not on file    FAMILY HISTORY: Family History  Problem Relation Age of Onset  . Osteoporosis Mother   . Heart attack Sister   . Diabetes Sister   . Diabetes Father   . Heart attack Sister   . Stroke Sister   .  Deep vein thrombosis Sister   . Breast cancer Neg Hx     ALLERGIES:  has No Known Allergies.  MEDICATIONS:  Current Outpatient Medications  Medication Sig Dispense Refill  . alendronate (FOSAMAX) 70 MG tablet Take 70 mg by mouth once a week.  6  . aspirin EC 81 MG tablet Take 81 mg by mouth daily.    Marland Kitchen atorvastatin (LIPITOR) 40 MG tablet Take 40 mg by mouth daily at 6 PM.     . Calcium Carbonate-Vitamin D 600-400 MG-UNIT tablet Take by mouth. Take 1 tablet by mouth 2 (two) times daily with meals    . Cholecalciferol (VITAMIN D3) 50000 units CAPS Take 1 capsule by mouth once a week.  0  . Coenzyme Q10 (CO Q-10) 200 MG CAPS Take 200 mg by mouth daily.    . Cyanocobalamin (VITAMIN B 12 PO) Take 2,500 mcg by mouth 1 day or 1 dose.    . diltiazem (CARDIZEM CD) 360 MG 24 hr capsule Take 360 mg by mouth daily. In am.    . ergocalciferol (VITAMIN D2)  50000 units capsule Take 50,000 Units by mouth once a week.    . ferrous sulfate 325 (65 FE) MG tablet Take 325 mg by mouth daily.    . folic acid (FOLVITE) 314 MCG tablet Take 400 mcg by mouth 2 (two) times daily.    . hydrochlorothiazide (HYDRODIURIL) 25 MG tablet Take 25 mg by mouth every morning.     . insulin detemir (LEVEMIR) 100 UNIT/ML injection Inject 30-45 Units into the skin 2 (two) times daily. 30 units in the morning before breakfast & 45 units in the evening before supper    . Insulin Glargine (LANTUS SOLOSTAR) 100 UNIT/ML Solostar Pen Inject 35 units under the skin in the morning and 45 units under the skin in the evening.    . insulin lispro (HUMALOG) 100 UNIT/ML injection Inject 25 Units into the skin 3 (three) times daily before meals.    Javier Docker Oil 350 MG CAPS Take 350 mg by mouth daily at 3 pm.    . losartan (COZAAR) 100 MG tablet Take 100 mg by mouth daily. In am.    . MAGNESIUM OXIDE 400 PO Take by mouth daily.    . Menthol, Topical Analgesic, (BIOFREEZE ROLL-ON EX) Apply 1 application topically 4 (four) times daily as needed (for pain.).    Marland Kitchen metFORMIN (GLUCOPHAGE) 1000 MG tablet Take 1,000 mg by mouth 2 (two) times daily after a meal.     . Multiple Vitamins-Minerals (PRESERVISION AREDS 2 PO) Take 1 tablet by mouth daily at 3 pm.    . nicotine polacrilex (NICORETTE) 2 MG gum Take by mouth. Take 2 mg by mouth as needed for Smoking cessation Chew the gum until it tingles and then park it between your cheek and gum. Repeat    . ondansetron (ZOFRAN) 4 MG tablet Take 1 tablet (4 mg total) by mouth every 8 (eight) hours as needed for nausea or vomiting. (Patient not taking: Reported on 02/04/2018) 30 tablet 0  . oxyCODONE (OXY IR/ROXICODONE) 5 MG immediate release tablet Take 1 tablet (5 mg total) by mouth every 6 (six) hours as needed for severe pain. (Patient not taking: Reported on 12/08/2017) 40 tablet 0  . Pyridoxine HCl (VITAMIN B-6 PO) Take by mouth.     No current  facility-administered medications for this visit.      PHYSICAL EXAMINATION: ECOG PERFORMANCE STATUS: 1 - Symptomatic but completely ambulatory Vitals:  05/06/18 1322  BP: 136/63  Pulse: 68  Resp: 18  Temp: 97.7 F (36.5 C)   Filed Weights   05/06/18 1322  Weight: 147 lb 12.8 oz (67 kg)    Physical Exam Constitutional:      General: She is not in acute distress. HENT:     Head: Normocephalic and atraumatic.  Eyes:     General: No scleral icterus.    Pupils: Pupils are equal, round, and reactive to light.  Neck:     Musculoskeletal: Normal range of motion and neck supple.  Cardiovascular:     Rate and Rhythm: Normal rate and regular rhythm.     Heart sounds: Normal heart sounds.  Pulmonary:     Effort: Pulmonary effort is normal. No respiratory distress.     Breath sounds: No wheezing.  Abdominal:     General: Bowel sounds are normal. There is no distension.     Palpations: Abdomen is soft. There is no mass.     Tenderness: There is no abdominal tenderness.  Musculoskeletal: Normal range of motion.        General: No deformity.  Skin:    General: Skin is warm and dry.     Findings: No erythema or rash.  Neurological:     Mental Status: She is alert and oriented to person, place, and time.     Cranial Nerves: No cranial nerve deficit.     Coordination: Coordination normal.     Comments: Chronic head tremor  Psychiatric:        Behavior: Behavior normal.        Thought Content: Thought content normal.      LABORATORY DATA:  I have reviewed the data as listed Lab Results  Component Value Date   WBC 9.9 05/04/2018   HGB 10.1 (L) 05/04/2018   HCT 33.4 (L) 05/04/2018   MCV 89.5 05/04/2018   PLT 321 05/04/2018   No results for input(s): NA, K, CL, CO2, GLUCOSE, BUN, CREATININE, CALCIUM, GFRNONAA, GFRAA, PROT, ALBUMIN, AST, ALT, ALKPHOS, BILITOT, BILIDIR, IBILI in the last 8760 hours. Iron/TIBC/Ferritin/ %Sat    Component Value Date/Time   IRON 65  05/04/2018 1404   TIBC 428 05/04/2018 1404   FERRITIN 23 05/04/2018 1404   IRONPCTSAT 15 05/04/2018 1404        ASSESSMENT & PLAN:  1. Iron deficiency anemia due to chronic blood loss   2. AVM (arteriovenous malformation) of small bowel, acquired    Labs reviewed and discussed with patient.  Hemoglobin slightly decreased from 10.9 to 10.1. Iron panel showed increased TIBC 428, iron saturation 15, ferritin 23.  Ferritin decreased level in November 2019. She received 1 dose of Venofer in November 2019. Iron store slightly decreased.  Indicating ongoing blood loss.  This is consistent with the finding of multiple AVMs in the small bowel. Advised patient to continue follow-up with gastroenterology for the management of AVM. From hematology aspect, recommend IV Venofer periodically to maintain hemoglobin level. Patient is very concerned about cost of IV iron infusion. She prefers to try decrease oral iron tablets.  Recommend: To ferrous sulfate 325 twice daily, recommend vitamin C supplements. Repeat CBC, iron, ferritin, TIBC in 4 weeks. If no improvement or further decrease, patient may consider IV Venofer at that time . Return to clinic for MD assessment with lab CBC, iron, TIBC, ferritin in 3 months. Addendum 05/11/2018. Patient communicated with Dr.Toledo's office and maybe willing to proceed with IV Venofer. Will arrange.   Orders Placed  This Encounter  Procedures  . CBC with Differential/Platelet    Standing Status:   Future    Standing Expiration Date:   05/07/2019  . Ferritin    Standing Status:   Future    Standing Expiration Date:   05/07/2019  . Iron and TIBC    Standing Status:   Future    Standing Expiration Date:   05/07/2019  . CBC with Differential/Platelet    Standing Status:   Future    Standing Expiration Date:   05/07/2019  . Ferritin    Standing Status:   Future    Standing Expiration Date:   05/07/2019  . Iron and TIBC    Standing Status:   Future    Standing  Expiration Date:   05/07/2019    All questions were answered. The patient knows to call the clinic with any problems questions or concerns. Earlie Server, MD, PhD Hematology Oncology [facility] Pager- [phone] 05/06/2018

## 2018-05-06 NOTE — Progress Notes (Signed)
Patient here for follow up. Pt concerned about colonoscopy done in October. She states that they took out some polyps and is still concerned as to why her iron continues to be low. She doesn't want to make another appointment with Dr. [name] because she cant afford it.

## 2018-05-09 ENCOUNTER — Ambulatory Visit
Admission: RE | Admit: 2018-05-09 | Discharge: 2018-05-09 | Disposition: A | Discharge status: Home / Self Care | Payer: Medicare Other | Source: Ambulatory Visit | Attending: Family | Admitting: Family

## 2018-05-09 DIAGNOSIS — Z1231 Encounter for screening mammogram for malignant neoplasm of breast: Secondary | ICD-10-CM | POA: Insufficient documentation

## 2018-05-11 ENCOUNTER — Telehealth: Payer: Self-pay | Admitting: *Deleted

## 2018-05-11 NOTE — Telephone Encounter (Signed)
Patient called to ask about her upcoming lab work. She wants to know if it is necessary? She would like a call from Dr. Collie Siad team.

## 2018-05-11 NOTE — Telephone Encounter (Signed)
Please see telephone note by Dr.Toledo in [facility]. She appears changing mind now. Willing to get IV iron.  Please call patient and arrange patient to proceed with IV Venofer 200mg  next week, repeat another dose in 4 weeks, and 8 weeks.  Cancel previous arranged Lab encounter.  Follow up in 3 months. Lab-MD, [cbc, iron ferritin].

## 2018-05-12 NOTE — Telephone Encounter (Signed)
I spoke to patient and she is agreeable with iron infusions. Message sent to scheduling to arrange appointments.

## 2018-05-18 ENCOUNTER — Inpatient Hospital Stay: Payer: Medicare Other

## 2018-05-18 VITALS — BP 107/72 | HR 78 | Resp 20

## 2018-05-18 DIAGNOSIS — D509 Iron deficiency anemia, unspecified: Secondary | ICD-10-CM

## 2018-05-18 DIAGNOSIS — D500 Iron deficiency anemia secondary to blood loss (chronic): Secondary | ICD-10-CM | POA: Diagnosis not present

## 2018-05-18 MED ORDER — SODIUM CHLORIDE 0.9 % IV SOLN
INTRAVENOUS | Status: DC
  Administered 2018-05-18: 14:00:00 via INTRAVENOUS
  Filled 2018-05-18: qty 250

## 2018-05-18 MED ORDER — IRON SUCROSE 20 MG/ML IV SOLN
200.0000 mg | Freq: Once | INTRAVENOUS | Status: AC
  Administered 2018-05-18: 200 mg via INTRAVENOUS
  Filled 2018-05-18: qty 10

## 2018-06-03 ENCOUNTER — Other Ambulatory Visit: Payer: BLUE CROSS/BLUE SHIELD

## 2018-06-08 ENCOUNTER — Inpatient Hospital Stay: Payer: Medicare Other | Attending: Oncology

## 2018-06-08 ENCOUNTER — Other Ambulatory Visit: Payer: Self-pay

## 2018-06-08 VITALS — BP 116/70 | HR 66 | Resp 20

## 2018-06-08 DIAGNOSIS — Z79899 Other long term (current) drug therapy: Secondary | ICD-10-CM | POA: Insufficient documentation

## 2018-06-08 DIAGNOSIS — D500 Iron deficiency anemia secondary to blood loss (chronic): Secondary | ICD-10-CM | POA: Insufficient documentation

## 2018-06-08 DIAGNOSIS — Q2730 Arteriovenous malformation, site unspecified: Secondary | ICD-10-CM | POA: Insufficient documentation

## 2018-06-08 DIAGNOSIS — D509 Iron deficiency anemia, unspecified: Secondary | ICD-10-CM

## 2018-06-08 MED ORDER — IRON SUCROSE 20 MG/ML IV SOLN
200.0000 mg | Freq: Once | INTRAVENOUS | Status: AC
  Administered 2018-06-08: 200 mg via INTRAVENOUS
  Filled 2018-06-08: qty 10

## 2018-06-08 MED ORDER — SODIUM CHLORIDE 0.9 % IV SOLN
INTRAVENOUS | Status: DC
  Administered 2018-06-08: 14:00:00 via INTRAVENOUS
  Filled 2018-06-08: qty 250

## 2018-07-05 ENCOUNTER — Other Ambulatory Visit: Payer: Self-pay

## 2018-07-06 ENCOUNTER — Other Ambulatory Visit: Payer: Self-pay | Admitting: Oncology

## 2018-07-06 ENCOUNTER — Other Ambulatory Visit: Payer: Self-pay

## 2018-07-06 ENCOUNTER — Inpatient Hospital Stay: Payer: Medicare Other | Attending: Oncology

## 2018-07-06 VITALS — BP 164/65 | HR 66 | Resp 20

## 2018-07-06 DIAGNOSIS — Z79899 Other long term (current) drug therapy: Secondary | ICD-10-CM | POA: Diagnosis not present

## 2018-07-06 DIAGNOSIS — Q2730 Arteriovenous malformation, site unspecified: Secondary | ICD-10-CM | POA: Diagnosis not present

## 2018-07-06 DIAGNOSIS — D509 Iron deficiency anemia, unspecified: Secondary | ICD-10-CM

## 2018-07-06 DIAGNOSIS — D500 Iron deficiency anemia secondary to blood loss (chronic): Secondary | ICD-10-CM | POA: Diagnosis present

## 2018-07-06 MED ORDER — SODIUM CHLORIDE 0.9 % IV SOLN
INTRAVENOUS | Status: DC
  Administered 2018-07-06: 14:00:00 via INTRAVENOUS
  Filled 2018-07-06: qty 250

## 2018-07-06 MED ORDER — IRON SUCROSE 20 MG/ML IV SOLN
200.0000 mg | Freq: Once | INTRAVENOUS | Status: AC
  Administered 2018-07-06: 200 mg via INTRAVENOUS
  Filled 2018-07-06: qty 10

## 2018-08-02 ENCOUNTER — Other Ambulatory Visit: Payer: Self-pay

## 2018-08-03 ENCOUNTER — Inpatient Hospital Stay: Payer: Medicare Other | Attending: Oncology

## 2018-08-03 ENCOUNTER — Other Ambulatory Visit: Payer: Self-pay

## 2018-08-03 DIAGNOSIS — D631 Anemia in chronic kidney disease: Secondary | ICD-10-CM | POA: Insufficient documentation

## 2018-08-03 DIAGNOSIS — E1122 Type 2 diabetes mellitus with diabetic chronic kidney disease: Secondary | ICD-10-CM | POA: Insufficient documentation

## 2018-08-03 DIAGNOSIS — N183 Chronic kidney disease, stage 3 (moderate): Secondary | ICD-10-CM | POA: Insufficient documentation

## 2018-08-03 DIAGNOSIS — D509 Iron deficiency anemia, unspecified: Secondary | ICD-10-CM | POA: Insufficient documentation

## 2018-08-03 DIAGNOSIS — Z79899 Other long term (current) drug therapy: Secondary | ICD-10-CM | POA: Diagnosis not present

## 2018-08-03 DIAGNOSIS — F17210 Nicotine dependence, cigarettes, uncomplicated: Secondary | ICD-10-CM | POA: Diagnosis not present

## 2018-08-03 DIAGNOSIS — E785 Hyperlipidemia, unspecified: Secondary | ICD-10-CM | POA: Insufficient documentation

## 2018-08-03 DIAGNOSIS — M1990 Unspecified osteoarthritis, unspecified site: Secondary | ICD-10-CM | POA: Diagnosis not present

## 2018-08-03 DIAGNOSIS — I129 Hypertensive chronic kidney disease with stage 1 through stage 4 chronic kidney disease, or unspecified chronic kidney disease: Secondary | ICD-10-CM | POA: Diagnosis not present

## 2018-08-03 DIAGNOSIS — D500 Iron deficiency anemia secondary to blood loss (chronic): Secondary | ICD-10-CM

## 2018-08-03 LAB — IRON AND TIBC
Iron: 72 ug/dL (ref 28–170)
Saturation Ratios: 18 % (ref 10.4–31.8)
TIBC: 395 ug/dL (ref 250–450)
UIBC: 323 ug/dL

## 2018-08-03 LAB — CBC WITH DIFFERENTIAL/PLATELET
Abs Immature Granulocytes: 0.02 10*3/uL (ref 0.00–0.07)
Basophils Absolute: 0.1 10*3/uL (ref 0.0–0.1)
Basophils Relative: 1 %
Eosinophils Absolute: 0.1 10*3/uL (ref 0.0–0.5)
Eosinophils Relative: 2 %
HCT: 33.8 % — ABNORMAL LOW (ref 36.0–46.0)
Hemoglobin: 10.4 g/dL — ABNORMAL LOW (ref 12.0–15.0)
Immature Granulocytes: 0 %
Lymphocytes Relative: 28 %
Lymphs Abs: 2 10*3/uL (ref 0.7–4.0)
MCH: 27.6 pg (ref 26.0–34.0)
MCHC: 30.8 g/dL (ref 30.0–36.0)
MCV: 89.7 fL (ref 80.0–100.0)
Monocytes Absolute: 0.8 10*3/uL (ref 0.1–1.0)
Monocytes Relative: 11 %
Neutro Abs: 4.2 10*3/uL (ref 1.7–7.7)
Neutrophils Relative %: 58 %
Platelets: 246 10*3/uL (ref 150–400)
RBC: 3.77 MIL/uL — ABNORMAL LOW (ref 3.87–5.11)
RDW: 15 % (ref 11.5–15.5)
WBC: 7.2 10*3/uL (ref 4.0–10.5)
nRBC: 0 % (ref 0.0–0.2)

## 2018-08-03 LAB — FERRITIN: Ferritin: 40 ng/mL (ref 11–307)

## 2018-08-05 ENCOUNTER — Other Ambulatory Visit: Payer: Self-pay

## 2018-08-08 ENCOUNTER — Encounter: Payer: Self-pay | Admitting: Oncology

## 2018-08-08 ENCOUNTER — Other Ambulatory Visit: Payer: Self-pay

## 2018-08-08 ENCOUNTER — Inpatient Hospital Stay (HOSPITAL_BASED_OUTPATIENT_CLINIC_OR_DEPARTMENT_OTHER): Payer: Medicare Other | Admitting: Oncology

## 2018-08-08 DIAGNOSIS — D509 Iron deficiency anemia, unspecified: Secondary | ICD-10-CM | POA: Diagnosis not present

## 2018-08-08 DIAGNOSIS — K5520 Angiodysplasia of colon without hemorrhage: Secondary | ICD-10-CM

## 2018-08-08 DIAGNOSIS — D649 Anemia, unspecified: Secondary | ICD-10-CM

## 2018-08-08 NOTE — Progress Notes (Signed)
HEMATOLOGY-ONCOLOGY TeleHEALTH VISIT PROGRESS NOTE  I connected with Hannah Sullivan on 08/08/18 at  1:00 PM EDT by video enabled telemedicine visit and verified that I am speaking with the correct person using two identifiers. I attempted to connect the patient for visual enabled telehealth visit via Webex.  Due to the technical difficulties with video,  Patient was transitioned to audio only visit.  I discussed the limitations, risks, security and privacy concerns of performing an evaluation and management service by telemedicine and the availability of in-person appointments. I also discussed with the patient that there may be a patient responsible charge related to this service. The patient expressed understanding and agreed to proceed.   Other persons participating in the visit and their role in the encounter:  Geraldine Solar, [place], check in patient   Janeann Merl, RN, check in patient.   Patient's location: Home  Provider's location: Home office Chief Complaint: follow up for iron deficiency anemia.     INTERVAL HISTORY Hannah Sullivan is a 68 y.o. female who has above history reviewed by me today presents for follow up visit for management of iron deficiency anemia.  Problems and complaints are listed below:  She was last seen by me on 05/06/2018. She received IV venofer 200mg  x 3 treatments.  Fatigue has improved since IV Venofer treatments.  Denies hematochezia, hematuria, hematemesis, epistaxis, black tarry stool or easy bruising.    Review of Systems  Constitutional: Negative for appetite change, chills, fatigue and fever.  HENT:   Negative for hearing loss and voice change.   Eyes: Negative for eye problems.  Respiratory: Negative for chest tightness and cough.   Cardiovascular: Negative for chest pain.  Gastrointestinal: Negative for abdominal distention, abdominal pain and blood in stool.  Endocrine: Negative for hot flashes.  Genitourinary: Negative for difficulty  urinating and frequency.   Musculoskeletal: Negative for arthralgias.  Skin: Negative for itching and rash.  Neurological: Negative for extremity weakness.  Hematological: Negative for adenopathy.  Psychiatric/Behavioral: Negative for confusion.    Past Medical History:  Diagnosis Date  . Anemia   . Arthritis   . Carotid disease, bilateral ([place])   . Diabetes mellitus without complication ([place])   . Diabetic neuropathy ([place])   . Diabetic retinopathy ([place])   . Dyspnea   . Hyperlipidemia   . Hypertension   . Hypothyroidism   . Microalbuminuria   . Onychomycosis   . Personal history of tobacco use, presenting hazards to health 10/10/2014  . Protein in urine   . Pyloric ulcer associated with Helicobacter pylori   . Spinal stenosis   . Thyroid disease 1980's   treated with radioactive iodine  . Tremors of nervous system    Past Surgical History:  Procedure Laterality Date  . ABDOMINAL HYSTERECTOMY  1196   Dr [facility], [place], [place] Dr Shana Chute  . CESAREAN SECTION  1971/1978   2 c-sections, Dr [facility], [place], [place]  . COLONOSCOPY WITH PROPOFOL N/A 01/05/2018   Procedure: COLONOSCOPY WITH PROPOFOL;  Surgeon: Toledo, Benay Pike, MD;  Location: [facility] ENDOSCOPY;  Service: Gastroenterology;  Laterality: N/A;  . DOPPLER ECHOCARDIOGRAPHY  02/2012   Left Corptid Artery-neck, Dr Chryl [facility]  . endoscope  2009   Dr Maceo Pro, [facility]  . ESOPHAGOGASTRODUODENOSCOPY (EGD) WITH PROPOFOL N/A 01/05/2018   Procedure: ESOPHAGOGASTRODUODENOSCOPY (EGD) WITH PROPOFOL;  Surgeon: Toledo, Benay Pike, MD;  Location: [facility] ENDOSCOPY;  Service: Gastroenterology;  Laterality: N/A;  . SHOULDER ARTHROSCOPY WITH ROTATOR CUFF REPAIR Right 10/20/2016  Procedure: SHOULDER ARTHROSCOPY WITH OPEN ROTATOR CUFF REPAIR;  Surgeon: Thornton Park, MD;  Location: [facility] ORS;  Service: Orthopedics;  Laterality: Right;  . TONSILECTOMY/ADENOIDECTOMY WITH MYRINGOTOMY  1961  . TONSILLECTOMY     . TUBAL LIGATION  1978   [facility]    Family History  Problem Relation Age of Onset  . Osteoporosis Mother   . Heart attack Sister   . Diabetes Sister   . Diabetes Father   . Heart attack Sister   . Stroke Sister   . Deep vein thrombosis Sister   . Breast cancer Neg Hx     Social History   Socioeconomic History  . Marital status: Widowed    Spouse name: Not on file  . Number of children: Not on file  . Years of education: Not on file  . Highest education level: Not on file  Occupational History  . Not on file  Social Needs  . Financial resource strain: Not on file  . Food insecurity:    Worry: Not on file    Inability: Not on file  . Transportation needs:    Medical: Not on file    Non-medical: Not on file  Tobacco Use  . Smoking status: Current Every Day Smoker    Packs/day: 0.50    Years: 45.00    Pack years: 22.50  . Smokeless tobacco: Never Used  . Tobacco comment: currently smoking 3-4 cigarretes a day  Substance and Sexual Activity  . Alcohol use: No    Comment: occasionally  . Drug use: No  . Sexual activity: Not on file  Lifestyle  . Physical activity:    Days per week: Not on file    Minutes per session: Not on file  . Stress: Not on file  Relationships  . Social connections:    Talks on phone: Not on file    Gets together: Not on file    Attends religious service: Not on file    Active member of club or organization: Not on file    Attends meetings of clubs or organizations: Not on file    Relationship status: Not on file  . Intimate partner violence:    Fear of current or ex partner: Not on file    Emotionally abused: Not on file    Physically abused: Not on file    Forced sexual activity: Not on file  Other Topics Concern  . Not on file  Social History Narrative  . Not on file    Current Outpatient Medications on File Prior to Visit  Medication Sig Dispense Refill  . aspirin EC 81 MG tablet Take 81 mg by mouth  daily.    Marland Kitchen atorvastatin (LIPITOR) 40 MG tablet Take 20 mg by mouth daily at 6 PM.     . Calcium Carbonate-Vitamin D 600-400 MG-UNIT tablet Take by mouth. Take 1 tablet by mouth 2 (two) times daily with meals    . Cholecalciferol (VITAMIN D3) 50000 units CAPS Take 1 capsule by mouth once a week.  0  . Coenzyme Q10 (CO Q-10) 200 MG CAPS Take 200 mg by mouth daily.    Marland Kitchen diltiazem (CARDIZEM CD) 360 MG 24 hr capsule Take 360 mg by mouth daily. In am.    . ferrous sulfate 325 (65 FE) MG tablet Take 325 mg by mouth daily.    . folic acid (FOLVITE) 315 MCG tablet Take 400 mcg by mouth 2 (two) times daily.    Marland Kitchen  hydrochlorothiazide (HYDRODIURIL) 25 MG tablet Take 25 mg by mouth every morning.     . Insulin Glargine (LANTUS SOLOSTAR) 100 UNIT/ML Solostar Pen Inject 35 units under the skin in the morning and 45 units under the skin in the evening.    . insulin lispro (HUMALOG) 100 UNIT/ML injection Inject 25 Units into the skin 3 (three) times daily before meals.    Marland Kitchen losartan (COZAAR) 100 MG tablet Take 100 mg by mouth daily. In am.    . MAGNESIUM OXIDE 400 PO Take by mouth daily.    . Menthol, Topical Analgesic, (BIOFREEZE ROLL-ON EX) Apply 1 application topically 4 (four) times daily as needed (for pain.).    Marland Kitchen metFORMIN (GLUCOPHAGE) 1000 MG tablet Take 1,000 mg by mouth 2 (two) times daily after a meal.     . Multiple Vitamins-Minerals (PRESERVISION AREDS 2 PO) Take 1 tablet by mouth daily at 3 pm.    . nicotine polacrilex (NICORETTE) 2 MG gum Take by mouth. Take 2 mg by mouth as needed for Smoking cessation Chew the gum until it tingles and then park it between your cheek and gum. Repeat    . Pyridoxine HCl (VITAMIN B-6 PO) Take by mouth.     No current facility-administered medications on file prior to visit.     No Known Allergies     Observations/Objective: There were no vitals filed for this visit. There is no height or weight on file to calculate BMI.  Physical Exam  Constitutional: She is  oriented to person, place, and time.  Neurological: She is alert and oriented to person, place, and time.    CBC    Component Value Date/Time   WBC 7.2 08/03/2018 1329   RBC 3.77 (L) 08/03/2018 1329   HGB 10.4 (L) 08/03/2018 1329   HCT 33.8 (L) 08/03/2018 1329   PLT 246 08/03/2018 1329   MCV 89.7 08/03/2018 1329   MCH 27.6 08/03/2018 1329   MCHC 30.8 08/03/2018 1329   RDW 15.0 08/03/2018 1329   LYMPHSABS 2.0 08/03/2018 1329   MONOABS 0.8 08/03/2018 1329   EOSABS 0.1 08/03/2018 1329   BASOSABS 0.1 08/03/2018 1329    CMP     Component Value Date/Time   NA 140 10/13/2016 1303   K 4.3 10/13/2016 1303   CL 106 10/13/2016 1303   CO2 25 10/13/2016 1303   GLUCOSE 130 (H) 10/13/2016 1303   BUN 26 (H) 10/13/2016 1303   CREATININE 0.98 10/13/2016 1303   CALCIUM 9.2 10/13/2016 1303   GFRNONAA 58 (L) 10/13/2016 1303   GFRAA >60 10/13/2016 1303     Assessment and Plan: 1. Normocytic anemia   2. Iron deficiency anemia, unspecified iron deficiency anemia type   3. AVM (arteriovenous malformation) of small bowel, acquired    Labs are reviewed and discussed with patient.  Her iron panel has improved. Ferritin level is 40, and Iron saturation of 18.  Cbc showed hemoglobin of 10.4, she has responded to IV iron treatments.  I had a lengthy discussion with patient that I recommend checking additonal labs to see if there is any additional etiology of her anemia.   Patient has a lot of questions and I tried my best to answer all questions to her satisfaction.   Follow Up Instructions: Telephone visit one week after labs to discuss lab results and plan.    I discussed the assessment and treatment plan with the patient. The patient was provided an opportunity to ask questions and all were answered.  The patient agreed with the plan and demonstrated an understanding of the instructions.  The patient was advised to call back or seek an in-person evaluation if the symptoms worsen or if the  condition fails to improve as anticipated.   I provided 20 minutes of face-to-face video visit time during this encounter, and > 50% was spent counseling as documented under my assessment & plan.   Earlie Server, MD 08/08/2018 5:08 PM

## 2018-08-08 NOTE — Progress Notes (Signed)
Called patient today for Telehealth visit via

## 2018-08-12 ENCOUNTER — Other Ambulatory Visit: Payer: Self-pay

## 2018-08-12 ENCOUNTER — Inpatient Hospital Stay: Payer: Medicare Other

## 2018-08-12 DIAGNOSIS — D649 Anemia, unspecified: Secondary | ICD-10-CM

## 2018-08-12 DIAGNOSIS — I129 Hypertensive chronic kidney disease with stage 1 through stage 4 chronic kidney disease, or unspecified chronic kidney disease: Secondary | ICD-10-CM | POA: Diagnosis not present

## 2018-08-12 LAB — COMPREHENSIVE METABOLIC PANEL
ALT: 16 U/L (ref 0–44)
AST: 18 U/L (ref 15–41)
Albumin: 3.8 g/dL (ref 3.5–5.0)
Alkaline Phosphatase: 47 U/L (ref 38–126)
Anion gap: 9 (ref 5–15)
BUN: 29 mg/dL — ABNORMAL HIGH (ref 8–23)
CO2: 25 mmol/L (ref 22–32)
Calcium: 9.7 mg/dL (ref 8.9–10.3)
Chloride: 106 mmol/L (ref 98–111)
Creatinine, Ser: 1.11 mg/dL — ABNORMAL HIGH (ref 0.44–1.00)
GFR calc Af Amer: 59 mL/min — ABNORMAL LOW (ref >60)
GFR calc non Af Amer: 51 mL/min — ABNORMAL LOW (ref >60)
Glucose, Bld: 119 mg/dL — ABNORMAL HIGH (ref 70–99)
Potassium: 3.8 mmol/L (ref 3.5–5.1)
Sodium: 140 mmol/L (ref 135–145)
Total Bilirubin: 0.4 mg/dL (ref 0.3–1.2)
Total Protein: 6.8 g/dL (ref 6.5–8.1)

## 2018-08-12 LAB — RETIC PANEL
Immature Retic Fract: 18.7 % — ABNORMAL HIGH (ref 2.3–15.9)
RBC.: 3.85 MIL/uL — ABNORMAL LOW (ref 3.87–5.11)
Retic Count, Absolute: 83.2 10*3/uL (ref 19.0–186.0)
Retic Ct Pct: 2.2 % (ref 0.4–3.1)
Reticulocyte Hemoglobin: 29.3 pg (ref >27.9)

## 2018-08-12 LAB — TSH: TSH: 6.989 u[IU]/mL — ABNORMAL HIGH (ref 0.350–4.500)

## 2018-08-16 LAB — MULTIPLE MYELOMA PANEL, SERUM
Albumin SerPl Elph-Mcnc: 3.3 g/dL (ref 2.9–4.4)
Albumin/Glob SerPl: 1.2 (ref 0.7–1.7)
Alpha 1: 0.2 g/dL (ref 0.0–0.4)
Alpha2 Glob SerPl Elph-Mcnc: 0.9 g/dL (ref 0.4–1.0)
B-Globulin SerPl Elph-Mcnc: 1.3 g/dL (ref 0.7–1.3)
Gamma Glob SerPl Elph-Mcnc: 0.5 g/dL (ref 0.4–1.8)
Globulin, Total: 3 g/dL (ref 2.2–3.9)
IgA: 308 mg/dL (ref 87–352)
IgG (Immunoglobin G), Serum: 597 mg/dL (ref 586–1602)
IgM (Immunoglobulin M), Srm: 72 mg/dL (ref 26–217)
Total Protein ELP: 6.3 g/dL (ref 6.0–8.5)

## 2018-08-16 LAB — KAPPA/LAMBDA LIGHT CHAINS
Kappa free light chain: 67.3 mg/L — ABNORMAL HIGH (ref 3.3–19.4)
Kappa, lambda light chain ratio: 2.37 — ABNORMAL HIGH (ref 0.26–1.65)
Lambda free light chains: 28.4 mg/L — ABNORMAL HIGH (ref 5.7–26.3)

## 2018-08-18 ENCOUNTER — Other Ambulatory Visit: Payer: Self-pay

## 2018-08-19 ENCOUNTER — Inpatient Hospital Stay (HOSPITAL_BASED_OUTPATIENT_CLINIC_OR_DEPARTMENT_OTHER): Payer: Medicare Other | Admitting: Oncology

## 2018-08-19 ENCOUNTER — Encounter: Payer: Self-pay | Admitting: Oncology

## 2018-08-19 DIAGNOSIS — N183 Chronic kidney disease, stage 3 (moderate): Secondary | ICD-10-CM

## 2018-08-19 DIAGNOSIS — Z79899 Other long term (current) drug therapy: Secondary | ICD-10-CM

## 2018-08-19 DIAGNOSIS — D509 Iron deficiency anemia, unspecified: Secondary | ICD-10-CM | POA: Diagnosis not present

## 2018-08-19 DIAGNOSIS — D631 Anemia in chronic kidney disease: Secondary | ICD-10-CM

## 2018-08-19 DIAGNOSIS — F17210 Nicotine dependence, cigarettes, uncomplicated: Secondary | ICD-10-CM | POA: Diagnosis not present

## 2018-08-19 DIAGNOSIS — Z7982 Long term (current) use of aspirin: Secondary | ICD-10-CM

## 2018-08-19 NOTE — Progress Notes (Signed)
Patient contacted for telehealth visit. Pt concerned about labwork.

## 2018-08-20 NOTE — Progress Notes (Signed)
HEMATOLOGY-ONCOLOGY TeleHEALTH VISIT PROGRESS NOTE  I connected with Hannah Sullivan on 08/20/18 at  2:45 PM EDT by video enabled telemedicine visit and verified that I am speaking with the correct person using two identifiers. I attempted to connect the patient for visual enabled telehealth visit via Webex.  Due to the technical difficulties with video,  Patient was transitioned to audio only visit.  I discussed the limitations, risks, security and privacy concerns of performing an evaluation and management service by telemedicine and the availability of in-person appointments. I also discussed with the patient that there may be a patient responsible charge related to this service. The patient expressed understanding and agreed to proceed.   Other persons participating in the visit and their role in the encounter:  Janeann Merl, RN, check in patient.   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    INTERVAL HISTORY Hannah Sullivan is a 68 y.o. female who has above history reviewed by me today presents for follow up visit for management of iron deficiency anemia.  Problems and complaints are listed below:  Patient was recommend to proceed with additional lab work up for her anemia.  She has no new complaints.  Review of Systems  Constitutional: Negative for appetite change, chills, fatigue and fever.  HENT:   Negative for hearing loss and voice change.   Eyes: Negative for eye problems.  Respiratory: Negative for chest tightness and cough.   Cardiovascular: Negative for chest pain.  Gastrointestinal: Negative for abdominal distention, abdominal pain and blood in stool.  Endocrine: Negative for hot flashes.  Genitourinary: Negative for difficulty urinating and frequency.   Musculoskeletal: Negative for arthralgias.  Skin: Negative for itching and rash.  Neurological: Negative for extremity weakness.  Hematological: Negative for adenopathy.   Psychiatric/Behavioral: Negative for confusion.    Past Medical History:  Diagnosis Date  . Anemia   . Arthritis   . Carotid disease, bilateral ([place])   . Diabetes mellitus without complication ([place])   . Diabetic neuropathy ([place])   . Diabetic retinopathy ([place])   . Dyspnea   . Hyperlipidemia   . Hypertension   . Hypothyroidism   . Microalbuminuria   . Onychomycosis   . Personal history of tobacco use, presenting hazards to health 10/10/2014  . Protein in urine   . Pyloric ulcer associated with Helicobacter pylori   . Spinal stenosis   . Thyroid disease 1980's   treated with radioactive iodine  . Tremors of nervous system    Past Surgical History:  Procedure Laterality Date  . ABDOMINAL HYSTERECTOMY  1196   Dr [facility], [place], [place] Dr Shana Chute  . CESAREAN SECTION  1971/1978   2 c-sections, Dr [facility], [place], [place]  . COLONOSCOPY WITH PROPOFOL N/A 01/05/2018   Procedure: COLONOSCOPY WITH PROPOFOL;  Surgeon: Toledo, Benay Pike, MD;  Location: [facility] ENDOSCOPY;  Service: Gastroenterology;  Laterality: N/A;  . DOPPLER ECHOCARDIOGRAPHY  02/2012   Left Corptid Artery-neck, Dr Chryl Heck [facility]  . endoscope  2009   Dr Maceo Pro, [facility]  . ESOPHAGOGASTRODUODENOSCOPY (EGD) WITH PROPOFOL N/A 01/05/2018   Procedure: ESOPHAGOGASTRODUODENOSCOPY (EGD) WITH PROPOFOL;  Surgeon: Toledo, Benay Pike, MD;  Location: [facility] ENDOSCOPY;  Service: Gastroenterology;  Laterality: N/A;  . SHOULDER ARTHROSCOPY WITH ROTATOR CUFF REPAIR Right 10/20/2016   Procedure: SHOULDER ARTHROSCOPY WITH OPEN ROTATOR CUFF REPAIR;  Surgeon: Thornton Park, MD;  Location: [facility] ORS;  Service: Orthopedics;  Laterality: Right;  . TONSILECTOMY/ADENOIDECTOMY WITH MYRINGOTOMY  1961  .  TONSILLECTOMY    . TUBAL LIGATION  1978   [facility]    Family History  Problem Relation Age of Onset  . Osteoporosis Mother   . Heart attack Sister   . Diabetes Sister   . Diabetes  Father   . Heart attack Sister   . Stroke Sister   . Deep vein thrombosis Sister   . Breast cancer Neg Hx     Social History   Socioeconomic History  . Marital status: Widowed    Spouse name: Not on file  . Number of children: Not on file  . Years of education: Not on file  . Highest education level: Not on file  Occupational History  . Not on file  Social Needs  . Financial resource strain: Not on file  . Food insecurity:    Worry: Not on file    Inability: Not on file  . Transportation needs:    Medical: Not on file    Non-medical: Not on file  Tobacco Use  . Smoking status: Current Every Day Smoker    Packs/day: 0.50    Years: 45.00    Pack years: 22.50  . Smokeless tobacco: Never Used  . Tobacco comment: currently smoking 3-4 cigarretes a day  Substance and Sexual Activity  . Alcohol use: No    Comment: occasionally  . Drug use: No  . Sexual activity: Not on file  Lifestyle  . Physical activity:    Days per week: Not on file    Minutes per session: Not on file  . Stress: Not on file  Relationships  . Social connections:    Talks on phone: Not on file    Gets together: Not on file    Attends religious service: Not on file    Active member of club or organization: Not on file    Attends meetings of clubs or organizations: Not on file    Relationship status: Not on file  . Intimate partner violence:    Fear of current or ex partner: Not on file    Emotionally abused: Not on file    Physically abused: Not on file    Forced sexual activity: Not on file  Other Topics Concern  . Not on file  Social History Narrative  . Not on file    Current Outpatient Medications on File Prior to Visit  Medication Sig Dispense Refill  . aspirin EC 81 MG tablet Take 81 mg by mouth daily.    Marland Kitchen atorvastatin (LIPITOR) 40 MG tablet Take 20 mg by mouth daily at 6 PM.     . Calcium Carbonate-Vitamin D 600-400 MG-UNIT tablet Take by mouth. Take 1 tablet by mouth 2 (two) times  daily with meals    . Cholecalciferol (VITAMIN D3) 50000 units CAPS Take 1 capsule by mouth once a week.  0  . Coenzyme Q10 (CO Q-10) 200 MG CAPS Take 200 mg by mouth daily.    Marland Kitchen diltiazem (CARDIZEM CD) 360 MG 24 hr capsule Take 360 mg by mouth daily. In am.    . ferrous sulfate 325 (65 FE) MG tablet Take 325 mg by mouth daily.    . folic acid (FOLVITE) 323 MCG tablet Take 400 mcg by mouth 2 (two) times daily.    . hydrochlorothiazide (HYDRODIURIL) 25 MG tablet Take 25 mg by mouth every morning.     . Insulin Glargine (LANTUS SOLOSTAR) 100 UNIT/ML Solostar Pen Inject 35 units under the skin in  the morning and 45 units under the skin in the evening.    . insulin lispro (HUMALOG) 100 UNIT/ML injection Inject 25 Units into the skin 3 (three) times daily before meals.    Marland Kitchen losartan (COZAAR) 100 MG tablet Take 100 mg by mouth daily. In am.    . MAGNESIUM OXIDE 400 PO Take by mouth daily.    . metFORMIN (GLUCOPHAGE) 1000 MG tablet Take 1,000 mg by mouth 2 (two) times daily after a meal.     . Multiple Vitamins-Minerals (PRESERVISION AREDS 2 PO) Take 1 tablet by mouth daily at 3 pm.    . nicotine polacrilex (NICORETTE) 2 MG gum Take by mouth. Take 2 mg by mouth as needed for Smoking cessation Chew the gum until it tingles and then park it between your cheek and gum. Repeat    . Pyridoxine HCl (VITAMIN B-6 PO) Take by mouth.    . Menthol, Topical Analgesic, (BIOFREEZE ROLL-ON EX) Apply 1 application topically 4 (four) times daily as needed (for pain.).     No current facility-administered medications on file prior to visit.     No Known Allergies     Observations/Objective: There were no vitals filed for this visit. There is no height or weight on file to calculate BMI.  Physical Exam  Constitutional: She is oriented to person, place, and time.  Neurological: She is alert and oriented to person, place, and time.    CBC    Component Value Date/Time   WBC 7.2 08/03/2018 1329   RBC 3.85 (L)  08/12/2018 1337   RBC 3.77 (L) 08/03/2018 1329   HGB 10.4 (L) 08/03/2018 1329   HCT 33.8 (L) 08/03/2018 1329   PLT 246 08/03/2018 1329   MCV 89.7 08/03/2018 1329   MCH 27.6 08/03/2018 1329   MCHC 30.8 08/03/2018 1329   RDW 15.0 08/03/2018 1329   LYMPHSABS 2.0 08/03/2018 1329   MONOABS 0.8 08/03/2018 1329   EOSABS 0.1 08/03/2018 1329   BASOSABS 0.1 08/03/2018 1329    CMP     Component Value Date/Time   NA 140 08/12/2018 1337   K 3.8 08/12/2018 1337   CL 106 08/12/2018 1337   CO2 25 08/12/2018 1337   GLUCOSE 119 (H) 08/12/2018 1337   BUN 29 (H) 08/12/2018 1337   CREATININE 1.11 (H) 08/12/2018 1337   CALCIUM 9.7 08/12/2018 1337   PROT 6.8 08/12/2018 1337   ALBUMIN 3.8 08/12/2018 1337   AST 18 08/12/2018 1337   ALT 16 08/12/2018 1337   ALKPHOS 47 08/12/2018 1337   BILITOT 0.4 08/12/2018 1337   GFRNONAA 51 (L) 08/12/2018 1337   GFRAA 59 (L) 08/12/2018 1337     Assessment and Plan: 1. Anemia due to stage 3 chronic kidney disease ([place])   2. Iron deficiency anemia, unspecified iron deficiency anemia type    Labs are reviewed and discussed with patient.  SPEP did not reveal M spike.  Increased free kappa and lamda chain, ratio slightly elevated, compatible with chronic kidney disease.  Retic panel showed normal reticulocyte hemoglobin.  Anemia likely due to CKD. Patient has a lot of questions and I tried my best to answer all questions to her satisfaction.   Follow Up Instructions: Follow up in 3 months.   I discussed the assessment and treatment plan with the patient. The patient was provided an opportunity to ask questions and all were answered. The patient agreed with the plan and demonstrated an understanding of the instructions.  The patient was advised  to call back or seek an in-person evaluation if the symptoms worsen or if the condition fails to improve as anticipated.   I provided 15 minutes of face-to-face video visit time during this encounter, and > 50% was  spent counseling as documented under my assessment & plan.   Earlie Server, MD

## 2018-11-16 ENCOUNTER — Inpatient Hospital Stay: Payer: Medicare Other

## 2018-11-18 ENCOUNTER — Inpatient Hospital Stay: Payer: Medicare Other | Admitting: Oncology

## 2018-11-18 ENCOUNTER — Inpatient Hospital Stay: Payer: Medicare Other

## 2018-11-30 ENCOUNTER — Other Ambulatory Visit: Payer: Self-pay

## 2018-11-30 ENCOUNTER — Inpatient Hospital Stay: Payer: Medicare Other | Attending: Oncology

## 2018-11-30 DIAGNOSIS — D500 Iron deficiency anemia secondary to blood loss (chronic): Secondary | ICD-10-CM | POA: Diagnosis present

## 2018-11-30 DIAGNOSIS — I129 Hypertensive chronic kidney disease with stage 1 through stage 4 chronic kidney disease, or unspecified chronic kidney disease: Secondary | ICD-10-CM | POA: Diagnosis not present

## 2018-11-30 DIAGNOSIS — D631 Anemia in chronic kidney disease: Secondary | ICD-10-CM | POA: Insufficient documentation

## 2018-11-30 DIAGNOSIS — Q2739 Arteriovenous malformation, other site: Secondary | ICD-10-CM | POA: Insufficient documentation

## 2018-11-30 DIAGNOSIS — Z79899 Other long term (current) drug therapy: Secondary | ICD-10-CM | POA: Diagnosis not present

## 2018-11-30 DIAGNOSIS — D509 Iron deficiency anemia, unspecified: Secondary | ICD-10-CM

## 2018-11-30 DIAGNOSIS — N183 Chronic kidney disease, stage 3 (moderate): Secondary | ICD-10-CM | POA: Diagnosis not present

## 2018-11-30 LAB — IRON AND TIBC
Iron: 49 ug/dL (ref 28–170)
Saturation Ratios: 11 % (ref 10.4–31.8)
TIBC: 435 ug/dL (ref 250–450)
UIBC: 386 ug/dL

## 2018-11-30 LAB — CBC WITH DIFFERENTIAL/PLATELET
Abs Immature Granulocytes: 0.02 10*3/uL (ref 0.00–0.07)
Basophils Absolute: 0 10*3/uL (ref 0.0–0.1)
Basophils Relative: 1 %
Eosinophils Absolute: 0.1 10*3/uL (ref 0.0–0.5)
Eosinophils Relative: 2 %
HCT: 27.6 % — ABNORMAL LOW (ref 36.0–46.0)
Hemoglobin: 8 g/dL — ABNORMAL LOW (ref 12.0–15.0)
Immature Granulocytes: 0 %
Lymphocytes Relative: 28 %
Lymphs Abs: 1.9 10*3/uL (ref 0.7–4.0)
MCH: 26.1 pg (ref 26.0–34.0)
MCHC: 29 g/dL — ABNORMAL LOW (ref 30.0–36.0)
MCV: 89.9 fL (ref 80.0–100.0)
Monocytes Absolute: 0.7 10*3/uL (ref 0.1–1.0)
Monocytes Relative: 10 %
Neutro Abs: 4.1 10*3/uL (ref 1.7–7.7)
Neutrophils Relative %: 59 %
Platelets: 331 10*3/uL (ref 150–400)
RBC: 3.07 MIL/uL — ABNORMAL LOW (ref 3.87–5.11)
RDW: 17.2 % — ABNORMAL HIGH (ref 11.5–15.5)
WBC: 6.9 10*3/uL (ref 4.0–10.5)
nRBC: 0 % (ref 0.0–0.2)

## 2018-11-30 LAB — FERRITIN: Ferritin: 18 ng/mL (ref 11–307)

## 2018-12-01 NOTE — Progress Notes (Signed)
Patient is coming in for follow up, she is doing well no complaints.  She had a question but does not remember.

## 2018-12-02 ENCOUNTER — Inpatient Hospital Stay: Payer: Medicare Other

## 2018-12-02 ENCOUNTER — Inpatient Hospital Stay (HOSPITAL_BASED_OUTPATIENT_CLINIC_OR_DEPARTMENT_OTHER): Payer: Medicare Other | Admitting: Oncology

## 2018-12-02 ENCOUNTER — Other Ambulatory Visit: Payer: Self-pay

## 2018-12-02 VITALS — BP 121/76 | HR 80 | Resp 20

## 2018-12-02 VITALS — BP 144/68 | HR 79 | Temp 98.6°F | Resp 18 | Wt 150.1 lb

## 2018-12-02 DIAGNOSIS — K5520 Angiodysplasia of colon without hemorrhage: Secondary | ICD-10-CM

## 2018-12-02 DIAGNOSIS — D509 Iron deficiency anemia, unspecified: Secondary | ICD-10-CM

## 2018-12-02 DIAGNOSIS — N183 Chronic kidney disease, stage 3 (moderate): Secondary | ICD-10-CM | POA: Diagnosis not present

## 2018-12-02 DIAGNOSIS — D500 Iron deficiency anemia secondary to blood loss (chronic): Secondary | ICD-10-CM | POA: Diagnosis not present

## 2018-12-02 DIAGNOSIS — D631 Anemia in chronic kidney disease: Secondary | ICD-10-CM | POA: Diagnosis not present

## 2018-12-02 MED ORDER — IRON SUCROSE 20 MG/ML IV SOLN
200.0000 mg | Freq: Once | INTRAVENOUS | Status: AC
  Administered 2018-12-02: 200 mg via INTRAVENOUS

## 2018-12-02 MED ORDER — SODIUM CHLORIDE 0.9 % IV SOLN
INTRAVENOUS | Status: DC
  Administered 2018-12-02: 15:00:00 via INTRAVENOUS
  Filled 2018-12-02: qty 250

## 2018-12-03 ENCOUNTER — Encounter: Payer: Self-pay | Admitting: Oncology

## 2018-12-03 NOTE — Progress Notes (Signed)
Hematology/Oncology follow  up note [facility] [phone] Fax:([phone]   Patient Care Team: Perrin Maltese, MD as PCP - General (Internal Medicine)  REFERRING PROVIDER: Perrin Maltese, MD REASON FOR VISIT:  Follow up for  iron deficiency anemia  HISTORY OF PRESENTING ILLNESS:  Lovinia Sullivan is a  68 y.o.  female with PMH listed below who was referred to me for evaluation of iron deficiency anemia Patient follows up with primary care physician Dr. Yancey Flemings recently had labs done on 12/01/2017 at [facility] clinic. Labs reviewed.  CBC showed hemoglobin 7.6, hematocrit 27, MCV 76.5, WBC 10.8, platelet count 425.  Differential is normal CMP showed normal creatinine and bilirubin level. 11/18/2017 iron panel showed TIBC 537, saturation 2, 11/18/2017 ferritin 7. History of iron deficiency: She recalls remotely when she was pregnant she used to take iron supplementation.  Currently she takes oral iron supplementation since she got her blood results. Rectal bleeding: Denies reports that she has dark stool after taking oral iron supplementation. Menstrual bleeding/ Vaginal bleeding : Denies Hematemesis or hemoptysis : denies Blood in urine : denies  Pica: Denies Last endoscopy: She had a  Fatigue: reports worsening fatigue. Chronic onset, perisistent, no aggravating or improving factors, no associated symptoms.  SOB: denies Denies weight loss, fever or chills, abdominal pain, chest pain.  #GI work-up includes colonoscopy, endoscopy  colonoscopy in 2011 in [place] 01/05/2018 Dr.Toledo.  Upper endoscopy showed gastritis, hiatal hernia, no active bleeding source with discomfort.  Colonoscopy showed small polyps in the sigmoid colon, descending colon and transverse colon.  Resected and retrieved.  Nonbleeding internal hemorrhoids.  Otherwise normal. Pathology negative for malignancy.  # Patient has had upper and lower endoscopy done in October 2019.  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   INTERVAL HISTORY Hannah Sullivan is a 68 y.o. female who has above history reviewed by me today presents for follow up visit for management of iron deficiency anemia.  She has received IV Venofer previously. Today she reports feeling more fatigued. Denies any black stool, abdominal pain, chest pain, shortness of breath. She was last seen by [facility] gastroenterology Dr.Toledo on 04/07/2018. She also mentions that her cardiologist Dr.Khan mentioned to her that she has a "leak" in her heart    Review of Systems  Constitutional: Positive for malaise/fatigue. Negative for chills, fever and weight loss.  HENT: Negative for nosebleeds and sore throat.   Eyes: Negative for double vision, photophobia and redness.  Respiratory: Negative for cough, shortness of breath and wheezing.   Cardiovascular: Negative for chest pain, palpitations, orthopnea and leg swelling.  Gastrointestinal: Positive for constipation. Negative for abdominal pain, blood in stool, nausea and vomiting.  Genitourinary: Negative for dysuria.  Musculoskeletal: Negative for back pain, myalgias and neck pain.  Skin: Negative for itching and rash.  Neurological: Negative for dizziness, tingling and tremors.  Endo/Heme/Allergies: Negative for environmental allergies. Does not bruise/bleed easily.  Psychiatric/Behavioral: Negative for depression and hallucinations.    MEDICAL HISTORY:  Past Medical History:  Diagnosis Date  . Anemia   . Arthritis   . Carotid disease, bilateral ([place])   . Diabetes mellitus without complication ([place])   . Diabetic neuropathy ([place])   . Diabetic retinopathy ([place])   . Dyspnea   . Hyperlipidemia   . Hypertension   . Hypothyroidism   . Microalbuminuria   . Onychomycosis   . Personal history of tobacco use, presenting hazards to health  10/10/2014  . Protein in urine   . Pyloric ulcer  associated with Helicobacter pylori   . Spinal stenosis   . Thyroid disease 1980's   treated with radioactive iodine  . Tremors of nervous system     SURGICAL HISTORY: Past Surgical History:  Procedure Laterality Date  . ABDOMINAL HYSTERECTOMY  1196   Dr [facility], [place], [place] Dr Shana Chute  . CESAREAN SECTION  1971/1978   2 c-sections, Dr [facility], [place], [place]  . COLONOSCOPY WITH PROPOFOL N/A 01/05/2018   Procedure: COLONOSCOPY WITH PROPOFOL;  Surgeon: Toledo, Benay Pike, MD;  Location: [facility] ENDOSCOPY;  Service: Gastroenterology;  Laterality: N/A;  . DOPPLER ECHOCARDIOGRAPHY  02/2012   Left Corptid Artery-neck, Dr Chryl Heck [facility]  . endoscope  2009   Dr Maceo Pro, [facility]  . ESOPHAGOGASTRODUODENOSCOPY (EGD) WITH PROPOFOL N/A 01/05/2018   Procedure: ESOPHAGOGASTRODUODENOSCOPY (EGD) WITH PROPOFOL;  Surgeon: Toledo, Benay Pike, MD;  Location: [facility] ENDOSCOPY;  Service: Gastroenterology;  Laterality: N/A;  . SHOULDER ARTHROSCOPY WITH ROTATOR CUFF REPAIR Right 10/20/2016   Procedure: SHOULDER ARTHROSCOPY WITH OPEN ROTATOR CUFF REPAIR;  Surgeon: Thornton Park, MD;  Location: [facility] ORS;  Service: Orthopedics;  Laterality: Right;  . TONSILECTOMY/ADENOIDECTOMY WITH MYRINGOTOMY  1961  . TONSILLECTOMY    . [place]    SOCIAL HISTORY: Social History   Socioeconomic History  . Marital status: Widowed    Spouse name: Not on file  . Number of children: Not on file  . Years of education: Not on file  . Highest education level: Not on file  Occupational History  . Not on file  Social Needs  . Financial resource strain: Not on file  . Food insecurity    Worry: Not on file    Inability: Not on file  . Transportation needs    Medical: Not on file    Non-medical: Not on file  Tobacco Use  . Smoking status: Current Every Day Smoker    Packs/day: 0.50    Years: 45.00    Pack years: 22.50  . Smokeless  tobacco: Never Used  . Tobacco comment: currently smoking 3-4 cigarretes a day  Substance and Sexual Activity  . Alcohol use: No    Comment: occasionally  . Drug use: No  . Sexual activity: Not on file  Lifestyle  . Physical activity    Days per week: Not on file    Minutes per session: Not on file  . Stress: Not on file  Relationships  . Social Herbalist on phone: Not on file    Gets together: Not on file    Attends religious service: Not on file    Active member of club or organization: Not on file    Attends meetings of clubs or organizations: Not on file    Relationship status: Not on file  . Intimate partner violence    Fear of current or ex partner: Not on file    Emotionally abused: Not on file    Physically abused: Not on file    Forced sexual activity: Not on file  Other Topics Concern  . Not on file  Social History Narrative  . Not on file    FAMILY HISTORY: Family History  Problem Relation Age of Onset  . Osteoporosis Mother   . Heart attack Sister   . Diabetes Sister   . Diabetes Father   . Heart attack Sister   .  Stroke Sister   . Deep vein thrombosis Sister   . Breast cancer Neg Hx     ALLERGIES:  has No Known Allergies.  MEDICATIONS:  Current Outpatient Medications  Medication Sig Dispense Refill  . aspirin EC 81 MG tablet Take 81 mg by mouth daily.    Marland Kitchen atorvastatin (LIPITOR) 40 MG tablet Take 20 mg by mouth daily at 6 PM.     . Calcium Carbonate-Vitamin D 600-400 MG-UNIT tablet Take by mouth. Take 1 tablet by mouth 2 (two) times daily with meals    . Cholecalciferol (VITAMIN D3) 50000 units CAPS Take 1 capsule by mouth once a week.  0  . Coenzyme Q10 (CO Q-10) 200 MG CAPS Take 200 mg by mouth daily.    Marland Kitchen diltiazem (CARDIZEM CD) 300 MG 24 hr capsule Take 300 mg by mouth daily.     . ferrous sulfate 325 (65 FE) MG tablet Take 325 mg by mouth daily.    . folic acid (FOLVITE) A999333 MCG tablet Take 400 mcg by mouth 2 (two) times daily.     . hydrochlorothiazide (HYDRODIURIL) 25 MG tablet Take 25 mg by mouth every morning.     . Insulin Glargine (LANTUS SOLOSTAR) 100 UNIT/ML Solostar Pen Inject 35 units under the skin in the morning and 45 units under the skin in the evening.    . insulin lispro (HUMALOG) 100 UNIT/ML injection Inject 25 Units into the skin 3 (three) times daily before meals.    Marland Kitchen losartan (COZAAR) 100 MG tablet Take 100 mg by mouth daily. In am.    . MAGNESIUM OXIDE 400 PO Take by mouth daily.    . Menthol, Topical Analgesic, (BIOFREEZE ROLL-ON EX) Apply 1 application topically 4 (four) times daily as needed (for pain.).    Marland Kitchen metFORMIN (GLUCOPHAGE) 1000 MG tablet Take 1,000 mg by mouth 2 (two) times daily after a meal.     . Multiple Vitamins-Minerals (PRESERVISION AREDS 2 PO) Take 1 tablet by mouth daily at 3 pm.    . nicotine polacrilex (NICORETTE) 2 MG gum Take by mouth. Take 2 mg by mouth as needed for Smoking cessation Chew the gum until it tingles and then park it between your cheek and gum. Repeat    . Pyridoxine HCl (VITAMIN B-6 PO) Take by mouth.     No current facility-administered medications for this visit.      PHYSICAL EXAMINATION: ECOG PERFORMANCE STATUS: 1 - Symptomatic but completely ambulatory Vitals:   12/02/18 1320  BP: (!) 144/68  Pulse: 79  Resp: 18  Temp: 98.6 F (37 C)   Filed Weights   12/02/18 1320  Weight: 150 lb 1.6 oz (68.1 kg)    Physical Exam Constitutional:      General: She is not in acute distress. HENT:     Head: Normocephalic and atraumatic.  Eyes:     General: No scleral icterus.    Pupils: Pupils are equal, round, and reactive to light.  Neck:     Musculoskeletal: Normal range of motion and neck supple.  Cardiovascular:     Rate and Rhythm: Normal rate and regular rhythm.     Heart sounds: Normal heart sounds.  Pulmonary:     Effort: Pulmonary effort is normal. No respiratory distress.     Breath sounds: No wheezing.  Abdominal:     General: Bowel  sounds are normal. There is no distension.     Palpations: Abdomen is soft. There is no mass.  Tenderness: There is no abdominal tenderness.  Musculoskeletal: Normal range of motion.        General: No deformity.  Skin:    General: Skin is warm and dry.     Findings: No erythema or rash.  Neurological:     Mental Status: She is alert and oriented to person, place, and time.     Cranial Nerves: No cranial nerve deficit.     Coordination: Coordination normal.     Comments: Chronic head tremor  Psychiatric:        Behavior: Behavior normal.        Thought Content: Thought content normal.      LABORATORY DATA:  I have reviewed the data as listed Lab Results  Component Value Date   WBC 6.9 11/30/2018   HGB 8.0 (L) 11/30/2018   HCT 27.6 (L) 11/30/2018   MCV 89.9 11/30/2018   PLT 331 11/30/2018   Recent Labs    08/12/18 1337  NA 140  K 3.8  CL 106  CO2 25  GLUCOSE 119*  BUN 29*  CREATININE 1.11*  CALCIUM 9.7  GFRNONAA 51*  GFRAA 59*  PROT 6.8  ALBUMIN 3.8  AST 18  ALT 16  ALKPHOS 47  BILITOT 0.4   Iron/TIBC/Ferritin/ %Sat    Component Value Date/Time   IRON 49 11/30/2018 1319   TIBC 435 11/30/2018 1319   FERRITIN 18 11/30/2018 1319   IRONPCTSAT 11 11/30/2018 1319        ASSESSMENT & PLAN:  1. Iron deficiency anemia due to chronic blood loss   2. Anemia due to stage 3 chronic kidney disease ([place])   3. AVM (arteriovenous malformation) of small bowel, acquired    #Labs are reviewed and discussed with the patient. Hemoglobin has decreased to 8,  Iron panel showed decreased iron saturation 11 and a ferritin at 18 Given her history of small bowel multiple AVM, she may has some ongoing chronic blood loss. Recommend additional IV Venofer 200 mg weekly x4.  Her anemia also has a component of anemia secondary to CKD in which iron store need to be fully restored before erythropoietin replacement therapy can work efficiently.  Recommend patient to follow up  with Dr.Toledo for discussion of management of AVMs.  Patient mentioned that she has "leak" in her heart. No appreciable cardiac murmur. Recommend patient to conitnue follow up with cardiology [place].  . Return to clinic for MD assessment with lab CBC, iron, TIBC, ferritin in 3 months.  Orders Placed This Encounter  Procedures  . CBC with Differential/Platelet    Standing Status:   Future    Standing Expiration Date:   12/02/2019  . Iron and TIBC    Standing Status:   Future    Standing Expiration Date:   12/02/2019  . Ferritin    Standing Status:   Future    Standing Expiration Date:   12/02/2019    Patient has multiple questions. All questions were answered. The patient knows to call the clinic with any problems questions or concerns. We spent sufficient time to discuss many aspect of care, questions were answered to patient's satisfaction. Total face to face encounter time for this patient visit was 25 min. >50% of the time was  spent in counseling and coordination of care.  Earlie Server, MD, PhD Hematology Oncology [facility] Pager- [phone] 12/03/2018

## 2018-12-09 ENCOUNTER — Inpatient Hospital Stay: Payer: Medicare Other

## 2018-12-09 ENCOUNTER — Other Ambulatory Visit: Payer: Self-pay

## 2018-12-09 VITALS — BP 110/71 | HR 73 | Temp 98.2°F | Resp 20

## 2018-12-09 DIAGNOSIS — D500 Iron deficiency anemia secondary to blood loss (chronic): Secondary | ICD-10-CM | POA: Diagnosis not present

## 2018-12-09 DIAGNOSIS — D509 Iron deficiency anemia, unspecified: Secondary | ICD-10-CM

## 2018-12-09 MED ORDER — SODIUM CHLORIDE 0.9 % IV SOLN
INTRAVENOUS | Status: DC
  Administered 2018-12-09: 15:00:00 via INTRAVENOUS
  Filled 2018-12-09: qty 250

## 2018-12-09 MED ORDER — IRON SUCROSE 20 MG/ML IV SOLN
200.0000 mg | Freq: Once | INTRAVENOUS | Status: AC
  Administered 2018-12-09: 15:00:00 200 mg via INTRAVENOUS
  Filled 2018-12-09: qty 10

## 2018-12-16 ENCOUNTER — Inpatient Hospital Stay: Payer: Medicare Other

## 2018-12-16 ENCOUNTER — Other Ambulatory Visit: Payer: Self-pay

## 2018-12-16 VITALS — BP 109/76 | HR 74 | Temp 98.0°F | Resp 20

## 2018-12-16 DIAGNOSIS — D500 Iron deficiency anemia secondary to blood loss (chronic): Secondary | ICD-10-CM | POA: Diagnosis not present

## 2018-12-16 DIAGNOSIS — D509 Iron deficiency anemia, unspecified: Secondary | ICD-10-CM

## 2018-12-16 MED ORDER — SODIUM CHLORIDE 0.9 % IV SOLN
INTRAVENOUS | Status: DC
  Administered 2018-12-16: 13:00:00 via INTRAVENOUS
  Filled 2018-12-16: qty 250

## 2018-12-16 MED ORDER — IRON SUCROSE 20 MG/ML IV SOLN
200.0000 mg | Freq: Once | INTRAVENOUS | Status: AC
  Administered 2018-12-16: 200 mg via INTRAVENOUS
  Filled 2018-12-16: qty 10

## 2018-12-23 ENCOUNTER — Other Ambulatory Visit: Payer: Self-pay

## 2018-12-23 ENCOUNTER — Inpatient Hospital Stay: Payer: Medicare Other | Attending: Oncology

## 2018-12-23 DIAGNOSIS — D500 Iron deficiency anemia secondary to blood loss (chronic): Secondary | ICD-10-CM | POA: Insufficient documentation

## 2018-12-23 DIAGNOSIS — Q2739 Arteriovenous malformation, other site: Secondary | ICD-10-CM | POA: Insufficient documentation

## 2018-12-23 DIAGNOSIS — Z79899 Other long term (current) drug therapy: Secondary | ICD-10-CM | POA: Insufficient documentation

## 2018-12-23 DIAGNOSIS — I129 Hypertensive chronic kidney disease with stage 1 through stage 4 chronic kidney disease, or unspecified chronic kidney disease: Secondary | ICD-10-CM | POA: Insufficient documentation

## 2018-12-23 DIAGNOSIS — D631 Anemia in chronic kidney disease: Secondary | ICD-10-CM | POA: Insufficient documentation

## 2018-12-23 NOTE — Progress Notes (Unsigned)
Unable to get viable IV to give patient treatment.  Patient will get return appointment.  Patient understands plan and is in agreement.

## 2018-12-27 ENCOUNTER — Inpatient Hospital Stay: Payer: Medicare Other

## 2018-12-27 ENCOUNTER — Other Ambulatory Visit: Payer: Self-pay

## 2018-12-27 DIAGNOSIS — D631 Anemia in chronic kidney disease: Secondary | ICD-10-CM | POA: Diagnosis not present

## 2018-12-27 DIAGNOSIS — D500 Iron deficiency anemia secondary to blood loss (chronic): Secondary | ICD-10-CM | POA: Diagnosis present

## 2018-12-27 DIAGNOSIS — Z79899 Other long term (current) drug therapy: Secondary | ICD-10-CM | POA: Diagnosis not present

## 2018-12-27 DIAGNOSIS — D509 Iron deficiency anemia, unspecified: Secondary | ICD-10-CM

## 2018-12-27 DIAGNOSIS — Q2739 Arteriovenous malformation, other site: Secondary | ICD-10-CM | POA: Diagnosis not present

## 2018-12-27 DIAGNOSIS — I129 Hypertensive chronic kidney disease with stage 1 through stage 4 chronic kidney disease, or unspecified chronic kidney disease: Secondary | ICD-10-CM | POA: Diagnosis not present

## 2018-12-27 MED ORDER — IRON SUCROSE 20 MG/ML IV SOLN
200.0000 mg | Freq: Once | INTRAVENOUS | Status: AC
  Administered 2018-12-27: 200 mg via INTRAVENOUS
  Filled 2018-12-27: qty 10

## 2018-12-27 MED ORDER — SODIUM CHLORIDE 0.9 % IV SOLN
INTRAVENOUS | Status: DC
  Administered 2018-12-27: 14:00:00 via INTRAVENOUS
  Filled 2018-12-27: qty 250

## 2019-02-07 ENCOUNTER — Other Ambulatory Visit: Payer: Self-pay

## 2019-02-08 ENCOUNTER — Inpatient Hospital Stay: Payer: Medicare Other | Attending: Oncology

## 2019-02-08 ENCOUNTER — Other Ambulatory Visit: Payer: Self-pay

## 2019-02-08 DIAGNOSIS — Q2739 Arteriovenous malformation, other site: Secondary | ICD-10-CM | POA: Diagnosis not present

## 2019-02-08 DIAGNOSIS — Z79899 Other long term (current) drug therapy: Secondary | ICD-10-CM | POA: Insufficient documentation

## 2019-02-08 DIAGNOSIS — D509 Iron deficiency anemia, unspecified: Secondary | ICD-10-CM | POA: Insufficient documentation

## 2019-02-08 DIAGNOSIS — D500 Iron deficiency anemia secondary to blood loss (chronic): Secondary | ICD-10-CM

## 2019-02-08 LAB — CBC WITH DIFFERENTIAL/PLATELET
Abs Immature Granulocytes: 0.02 10*3/uL (ref 0.00–0.07)
Basophils Absolute: 0 10*3/uL (ref 0.0–0.1)
Basophils Relative: 1 %
Eosinophils Absolute: 0.1 10*3/uL (ref 0.0–0.5)
Eosinophils Relative: 2 %
HCT: 31.3 % — ABNORMAL LOW (ref 36.0–46.0)
Hemoglobin: 9.5 g/dL — ABNORMAL LOW (ref 12.0–15.0)
Immature Granulocytes: 0 %
Lymphocytes Relative: 33 %
Lymphs Abs: 2.5 10*3/uL (ref 0.7–4.0)
MCH: 27.6 pg (ref 26.0–34.0)
MCHC: 30.4 g/dL (ref 30.0–36.0)
MCV: 91 fL (ref 80.0–100.0)
Monocytes Absolute: 0.6 10*3/uL (ref 0.1–1.0)
Monocytes Relative: 8 %
Neutro Abs: 4.3 10*3/uL (ref 1.7–7.7)
Neutrophils Relative %: 56 %
Platelets: 304 10*3/uL (ref 150–400)
RBC: 3.44 MIL/uL — ABNORMAL LOW (ref 3.87–5.11)
RDW: 15.9 % — ABNORMAL HIGH (ref 11.5–15.5)
WBC: 7.6 10*3/uL (ref 4.0–10.5)
nRBC: 0 % (ref 0.0–0.2)

## 2019-02-08 LAB — IRON AND TIBC
Iron: 47 ug/dL (ref 28–170)
Saturation Ratios: 11 % (ref 10.4–31.8)
TIBC: 413 ug/dL (ref 250–450)
UIBC: 366 ug/dL

## 2019-02-08 LAB — FERRITIN: Ferritin: 31 ng/mL (ref 11–307)

## 2019-02-09 ENCOUNTER — Other Ambulatory Visit: Payer: Self-pay

## 2019-02-09 ENCOUNTER — Encounter: Payer: Self-pay | Admitting: Oncology

## 2019-02-09 NOTE — Progress Notes (Signed)
Patient stated that she had been doing well with no concerns. Patient stated that she is afraid of getting stuck several times for infusion.

## 2019-02-10 ENCOUNTER — Other Ambulatory Visit: Payer: Self-pay

## 2019-02-10 ENCOUNTER — Inpatient Hospital Stay (HOSPITAL_BASED_OUTPATIENT_CLINIC_OR_DEPARTMENT_OTHER): Payer: Medicare Other | Admitting: Oncology

## 2019-02-10 ENCOUNTER — Inpatient Hospital Stay: Payer: Medicare Other

## 2019-02-10 VITALS — BP 165/66 | HR 80 | Temp 98.8°F | Resp 18 | Wt 157.1 lb

## 2019-02-10 VITALS — BP 172/72 | HR 81 | Temp 97.3°F | Resp 18

## 2019-02-10 DIAGNOSIS — D631 Anemia in chronic kidney disease: Secondary | ICD-10-CM

## 2019-02-10 DIAGNOSIS — N1831 Chronic kidney disease, stage 3a: Secondary | ICD-10-CM

## 2019-02-10 DIAGNOSIS — D509 Iron deficiency anemia, unspecified: Secondary | ICD-10-CM | POA: Diagnosis not present

## 2019-02-10 DIAGNOSIS — K5520 Angiodysplasia of colon without hemorrhage: Secondary | ICD-10-CM | POA: Diagnosis not present

## 2019-02-10 MED ORDER — IRON SUCROSE 20 MG/ML IV SOLN
200.0000 mg | Freq: Once | INTRAVENOUS | Status: AC
  Administered 2019-02-10: 200 mg via INTRAVENOUS
  Filled 2019-02-10: qty 10

## 2019-02-10 MED ORDER — SODIUM CHLORIDE 0.9 % IV SOLN: 200.0000 mg | INTRAVENOUS | Status: DC

## 2019-02-10 MED ORDER — SODIUM CHLORIDE 0.9 % IV SOLN
Freq: Once | INTRAVENOUS | Status: AC
  Administered 2019-02-10: 14:00:00 via INTRAVENOUS
  Filled 2019-02-10: qty 250

## 2019-02-10 NOTE — Progress Notes (Signed)
Hematology/Oncology follow  up note [facility] [phone] [phone]   Patient Care Team: Perrin Maltese, MD as PCP - General (Internal Medicine)  REFERRING PROVIDER: Perrin Maltese, MD REASON FOR VISIT:  Follow up for  iron deficiency anemia  HISTORY OF PRESENTING ILLNESS:  Hannah Sullivan is a  68 y.o.  female with PMH listed below who was referred to me for evaluation of iron deficiency anemia Patient follows up with primary care physician Dr. Yancey Flemings recently had labs done on 12/01/2017 at [facility] clinic. Labs reviewed.  CBC showed hemoglobin 7.6, hematocrit 27, MCV 76.5, WBC 10.8, platelet count 425.  Differential is normal CMP showed normal creatinine and bilirubin level. 11/18/2017 iron panel showed TIBC 537, saturation 2, 11/18/2017 ferritin 7. History of iron deficiency: She recalls remotely when she was pregnant she used to take iron supplementation.  Currently she takes oral iron supplementation since she got her blood results. Rectal bleeding: Denies reports that she has dark stool after taking oral iron supplementation. Menstrual bleeding/ Vaginal bleeding : Denies Hematemesis or hemoptysis : denies Blood in urine : denies  Pica: Denies Last endoscopy: She had a  Fatigue: reports worsening fatigue. Chronic onset, perisistent, no aggravating or improving factors, no associated symptoms.  SOB: denies Denies weight loss, fever or chills, abdominal pain, chest pain.  #GI work-up includes colonoscopy, endoscopy  colonoscopy in 2011 in [place] 01/05/2018 Dr.Toledo.  Upper endoscopy showed gastritis, hiatal hernia, no active bleeding source with discomfort.  Colonoscopy showed small polyps in the sigmoid colon, descending colon and transverse colon.  Resected and retrieved.  Nonbleeding internal hemorrhoids.  Otherwise normal. Pathology negative for malignancy.  # Patient has had upper and lower endoscopy done in October 2019.  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   INTERVAL HISTORY Hannah Sullivan is a 68 y.o. female who has above history reviewed by me today presents for follow up visit for management of iron deficiency anemia.  She has received IV Venofer x3 since last visit..  Patient takes oral iron supplementation ferrous sulfate twice daily.  Reports tolerating well. Patient has small bowel AVM. Patient was last seen by [facility] gastroenterology on 12/14/2018. Chronic fatigue, at baseline.  Not worsened.    Review of Systems  Constitutional: Positive for malaise/fatigue. Negative for chills, fever and weight loss.  HENT: Negative for nosebleeds and sore throat.   Eyes: Negative for double vision, photophobia and redness.  Respiratory: Negative for cough, shortness of breath and wheezing.   Cardiovascular: Negative for chest pain, palpitations, orthopnea and leg swelling.  Gastrointestinal: Positive for constipation. Negative for abdominal pain, blood in stool, nausea and vomiting.  Genitourinary: Negative for dysuria.  Musculoskeletal: Negative for back pain, myalgias and neck pain.  Skin: Negative for itching and rash.  Neurological: Negative for dizziness, tingling and tremors.  Endo/Heme/Allergies: Negative for environmental allergies. Does not bruise/bleed easily.  Psychiatric/Behavioral: Negative for depression and hallucinations.    MEDICAL HISTORY:  Past Medical History:  Diagnosis Date  . Anemia   . Arthritis   . Carotid disease, bilateral ([place])   . Diabetes mellitus without complication ([place])   . Diabetic neuropathy ([place])   . Diabetic retinopathy ([place])   . Dyspnea   . Hyperlipidemia   . Hypertension   . Hypothyroidism   . Microalbuminuria   . Onychomycosis   . Personal history of tobacco use, presenting hazards to health 10/10/2014  . Protein in urine   .  Pyloric ulcer associated with Helicobacter pylori   .  Spinal stenosis   . Thyroid disease 1980's   treated with radioactive iodine  . Tremors of nervous system     SURGICAL HISTORY: Past Surgical History:  Procedure Laterality Date  . ABDOMINAL HYSTERECTOMY  1196   Dr [facility], [place], [place] Dr Shana Chute  . CESAREAN SECTION  1971/1978   2 c-sections, Dr [facility], [place], [place]  . COLONOSCOPY WITH PROPOFOL N/A 01/05/2018   Procedure: COLONOSCOPY WITH PROPOFOL;  Surgeon: Toledo, Benay Pike, MD;  Location: [facility] ENDOSCOPY;  Service: Gastroenterology;  Laterality: N/A;  . DOPPLER ECHOCARDIOGRAPHY  02/2012   Left Corptid Artery-neck, Dr Chryl Heck [facility]  . endoscope  2009   Dr Maceo Pro, [facility]  . ESOPHAGOGASTRODUODENOSCOPY (EGD) WITH PROPOFOL N/A 01/05/2018   Procedure: ESOPHAGOGASTRODUODENOSCOPY (EGD) WITH PROPOFOL;  Surgeon: Toledo, Benay Pike, MD;  Location: [facility] ENDOSCOPY;  Service: Gastroenterology;  Laterality: N/A;  . SHOULDER ARTHROSCOPY WITH ROTATOR CUFF REPAIR Right 10/20/2016   Procedure: SHOULDER ARTHROSCOPY WITH OPEN ROTATOR CUFF REPAIR;  Surgeon: Thornton Park, MD;  Location: [facility] ORS;  Service: Orthopedics;  Laterality: Right;  . TONSILECTOMY/ADENOIDECTOMY WITH MYRINGOTOMY  1961  . TONSILLECTOMY    . [place]    SOCIAL HISTORY: Social History   Socioeconomic History  . Marital status: Widowed    Spouse name: Not on file  . Number of children: Not on file  . Years of education: Not on file  . Highest education level: Not on file  Occupational History  . Not on file  Social Needs  . Financial resource strain: Not on file  . Food insecurity    Worry: Not on file    Inability: Not on file  . Transportation needs    Medical: Not on file    Non-medical: Not on file  Tobacco Use  . Smoking status: Former Smoker    Packs/day: 0.50    Years: 45.00    Pack years: 22.50    Types: Cigarettes    Quit date: 06/09/2018    Years since quitting:  0.6  . Smokeless tobacco: Never Used  Substance and Sexual Activity  . Alcohol use: No    Comment: occasionally  . Drug use: No  . Sexual activity: Not on file  Lifestyle  . Physical activity    Days per week: Not on file    Minutes per session: Not on file  . Stress: Not on file  Relationships  . Social Herbalist on phone: Not on file    Gets together: Not on file    Attends religious service: Not on file    Active member of club or organization: Not on file    Attends meetings of clubs or organizations: Not on file    Relationship status: Not on file  . Intimate partner violence    Fear of current or ex partner: Not on file    Emotionally abused: Not on file    Physically abused: Not on file    Forced sexual activity: Not on file  Other Topics Concern  . Not on file  Social History Narrative  . Not on file    FAMILY HISTORY: Family History  Problem Relation Age of Onset  . Osteoporosis Mother   . Heart attack Sister   . Diabetes Sister   . Diabetes Father   . Heart attack Sister   . Stroke Sister   .  Deep vein thrombosis Sister   . Breast cancer Neg Hx     ALLERGIES:  has No Known Allergies.  MEDICATIONS:  Current Outpatient Medications  Medication Sig Dispense Refill  . aspirin EC 81 MG tablet Take 81 mg by mouth daily.    Marland Kitchen atorvastatin (LIPITOR) 40 MG tablet Take 20 mg by mouth daily at 6 PM.     . Calcium Carbonate-Vitamin D 600-400 MG-UNIT tablet Take by mouth. Take 1 tablet by mouth 2 (two) times daily with meals    . Cholecalciferol (VITAMIN D3) 50000 units CAPS Take 1 capsule by mouth once a week.  0  . Coenzyme Q10 (CO Q-10) 200 MG CAPS Take 200 mg by mouth daily.    Marland Kitchen diltiazem (CARDIZEM CD) 300 MG 24 hr capsule Take 300 mg by mouth daily.     . ferrous sulfate 325 (65 FE) MG tablet Take 325 mg by mouth 2 (two) times daily with a meal.     . folic acid (FOLVITE) A999333 MCG tablet Take 400 mcg by mouth 2 (two) times daily.    .  hydrochlorothiazide (HYDRODIURIL) 25 MG tablet Take 25 mg by mouth every morning.     . Insulin Glargine (LANTUS SOLOSTAR) 100 UNIT/ML Solostar Pen 60 Units at bedtime.     . insulin lispro (HUMALOG) 100 UNIT/ML injection Inject 25 Units into the skin 3 (three) times daily before meals.    Marland Kitchen losartan (COZAAR) 100 MG tablet Take 100 mg by mouth daily. In am.    . MAGNESIUM OXIDE 400 PO Take by mouth daily.    . metFORMIN (GLUCOPHAGE) 1000 MG tablet Take 1,000 mg by mouth 2 (two) times daily after a meal.     . Multiple Vitamins-Minerals (PRESERVISION AREDS 2 PO) Take 1 tablet by mouth daily at 3 pm.    . Pyridoxine HCl (VITAMIN B-6 PO) Take by mouth.    . nicotine polacrilex (NICORETTE) 2 MG gum Take by mouth. Take 2 mg by mouth as needed for Smoking cessation Chew the gum until it tingles and then park it between your cheek and gum. Repeat     No current facility-administered medications for this visit.      PHYSICAL EXAMINATION: ECOG PERFORMANCE STATUS: 1 - Symptomatic but completely ambulatory Vitals:   02/09/19 1207  BP: (!) 165/66  Pulse: 80  Resp: 18  Temp: 98.8 F (37.1 C)   Filed Weights   02/09/19 1207  Weight: 157 lb 1.6 oz (71.3 kg)    Physical Exam Constitutional:      General: She is not in acute distress. HENT:     Head: Normocephalic and atraumatic.  Eyes:     General: No scleral icterus.    Pupils: Pupils are equal, round, and reactive to light.  Neck:     Musculoskeletal: Normal range of motion and neck supple.  Cardiovascular:     Rate and Rhythm: Normal rate and regular rhythm.     Heart sounds: Normal heart sounds.  Pulmonary:     Effort: Pulmonary effort is normal. No respiratory distress.     Breath sounds: No wheezing.  Abdominal:     General: Bowel sounds are normal. There is no distension.     Palpations: Abdomen is soft. There is no mass.     Tenderness: There is no abdominal tenderness.  Musculoskeletal: Normal range of motion.         General: No deformity.  Skin:    General: Skin is warm and dry.  Findings: No erythema or rash.  Neurological:     Mental Status: She is alert and oriented to person, place, and time.     Cranial Nerves: No cranial nerve deficit.     Coordination: Coordination normal.     Comments: Chronic head tremor  Psychiatric:        Behavior: Behavior normal.        Thought Content: Thought content normal.      LABORATORY DATA:  I have reviewed the data as listed Lab Results  Component Value Date   WBC 7.6 02/08/2019   HGB 9.5 (L) 02/08/2019   HCT 31.3 (L) 02/08/2019   MCV 91.0 02/08/2019   PLT 304 02/08/2019   Recent Labs    08/12/18 1337  NA 140  K 3.8  CL 106  CO2 25  GLUCOSE 119*  BUN 29*  CREATININE 1.11*  CALCIUM 9.7  GFRNONAA 51*  GFRAA 59*  PROT 6.8  ALBUMIN 3.8  AST 18  ALT 16  ALKPHOS 47  BILITOT 0.4   Iron/TIBC/Ferritin/ %Sat    Component Value Date/Time   IRON 47 02/08/2019 1337   TIBC 413 02/08/2019 1337   FERRITIN 31 02/08/2019 1337   IRONPCTSAT 11 02/08/2019 1337        ASSESSMENT & PLAN:  1. Iron deficiency anemia, unspecified iron deficiency anemia type   2. AVM (arteriovenous malformation) of small bowel, acquired   3. Anemia due to stage 3a chronic kidney disease    #Labs reviewed and discussed with patient. Hemoglobin 9.5. Iron panel shows ferritin 31, iron saturation 11, TIBC 413. I discussed with patient that anemia is multifactorial, secondary to iron deficiency, chronic blood loss from AVM, CKD. Recommend periodic IV Venofer treatments to keep her anemia stable. I recommend patient to proceed with 1 dose of Venofer 200 mg today. Repeat another IV venofer dose in 4 weeks.  I encourage patient to contact [facility] clinic gastroenterology Dr. Ricky Stabs office for discussion of management of AVM.  Marland Kitchen Return to clinic for MD assessment with lab CBC, iron, TIBC, ferritin in 3 months.  Orders Placed This Encounter  Procedures  . CBC     Standing Status:   Standing    Number of Occurrences:   1    Standing Expiration Date:   05/19/2019  . Ferritin    Standing Status:   Standing    Number of Occurrences:   1    Standing Expiration Date:   05/19/2019  . Iron and TIBC    Standing Status:   Standing    Number of Occurrences:   1    Standing Expiration Date:   05/19/2019     . Earlie Server, MD, PhD Hematology Oncology [facility] at [facility] Pager- [phone] 02/10/2019

## 2019-03-10 ENCOUNTER — Inpatient Hospital Stay: Payer: Medicare Other | Attending: Oncology

## 2019-05-10 ENCOUNTER — Inpatient Hospital Stay
Admission: EM | Admit: 2019-05-10 | Discharge: 2019-05-12 | DRG: 811 | Disposition: A | Discharge status: Home / Self Care | Payer: Medicare Other | Attending: Internal Medicine | Admitting: Internal Medicine

## 2019-05-10 ENCOUNTER — Encounter: Payer: Self-pay | Admitting: Emergency Medicine

## 2019-05-10 ENCOUNTER — Other Ambulatory Visit: Payer: Self-pay

## 2019-05-10 DIAGNOSIS — E11319 Type 2 diabetes mellitus with unspecified diabetic retinopathy without macular edema: Secondary | ICD-10-CM | POA: Diagnosis present

## 2019-05-10 DIAGNOSIS — K5520 Angiodysplasia of colon without hemorrhage: Secondary | ICD-10-CM

## 2019-05-10 DIAGNOSIS — K31811 Angiodysplasia of stomach and duodenum with bleeding: Secondary | ICD-10-CM | POA: Diagnosis present

## 2019-05-10 DIAGNOSIS — Z20822 Contact with and (suspected) exposure to covid-19: Secondary | ICD-10-CM | POA: Diagnosis present

## 2019-05-10 DIAGNOSIS — I10 Essential (primary) hypertension: Secondary | ICD-10-CM

## 2019-05-10 DIAGNOSIS — E1142 Type 2 diabetes mellitus with diabetic polyneuropathy: Secondary | ICD-10-CM | POA: Diagnosis present

## 2019-05-10 DIAGNOSIS — D509 Iron deficiency anemia, unspecified: Secondary | ICD-10-CM | POA: Diagnosis not present

## 2019-05-10 DIAGNOSIS — E119 Type 2 diabetes mellitus without complications: Secondary | ICD-10-CM

## 2019-05-10 DIAGNOSIS — I2510 Atherosclerotic heart disease of native coronary artery without angina pectoris: Secondary | ICD-10-CM | POA: Diagnosis present

## 2019-05-10 DIAGNOSIS — Z833 Family history of diabetes mellitus: Secondary | ICD-10-CM

## 2019-05-10 DIAGNOSIS — D62 Acute posthemorrhagic anemia: Principal | ICD-10-CM | POA: Diagnosis present

## 2019-05-10 DIAGNOSIS — E785 Hyperlipidemia, unspecified: Secondary | ICD-10-CM | POA: Diagnosis present

## 2019-05-10 DIAGNOSIS — E538 Deficiency of other specified B group vitamins: Secondary | ICD-10-CM | POA: Diagnosis present

## 2019-05-10 DIAGNOSIS — D519 Vitamin B12 deficiency anemia, unspecified: Secondary | ICD-10-CM | POA: Diagnosis present

## 2019-05-10 DIAGNOSIS — E039 Hypothyroidism, unspecified: Secondary | ICD-10-CM | POA: Diagnosis present

## 2019-05-10 DIAGNOSIS — Z823 Family history of stroke: Secondary | ICD-10-CM

## 2019-05-10 DIAGNOSIS — D649 Anemia, unspecified: Secondary | ICD-10-CM | POA: Diagnosis not present

## 2019-05-10 DIAGNOSIS — Z87891 Personal history of nicotine dependence: Secondary | ICD-10-CM

## 2019-05-10 DIAGNOSIS — I959 Hypotension, unspecified: Secondary | ICD-10-CM | POA: Diagnosis present

## 2019-05-10 DIAGNOSIS — Z794 Long term (current) use of insulin: Secondary | ICD-10-CM

## 2019-05-10 DIAGNOSIS — E663 Overweight: Secondary | ICD-10-CM | POA: Diagnosis present

## 2019-05-10 DIAGNOSIS — Z8249 Family history of ischemic heart disease and other diseases of the circulatory system: Secondary | ICD-10-CM

## 2019-05-10 DIAGNOSIS — Z6829 Body mass index (BMI) 29.0-29.9, adult: Secondary | ICD-10-CM

## 2019-05-10 DIAGNOSIS — F17200 Nicotine dependence, unspecified, uncomplicated: Secondary | ICD-10-CM | POA: Diagnosis present

## 2019-05-10 DIAGNOSIS — Z8711 Personal history of peptic ulcer disease: Secondary | ICD-10-CM

## 2019-05-10 DIAGNOSIS — E1169 Type 2 diabetes mellitus with other specified complication: Secondary | ICD-10-CM | POA: Diagnosis present

## 2019-05-10 DIAGNOSIS — Z7982 Long term (current) use of aspirin: Secondary | ICD-10-CM

## 2019-05-10 DIAGNOSIS — Z8262 Family history of osteoporosis: Secondary | ICD-10-CM

## 2019-05-10 DIAGNOSIS — E1165 Type 2 diabetes mellitus with hyperglycemia: Secondary | ICD-10-CM | POA: Diagnosis present

## 2019-05-10 LAB — CBC WITH DIFFERENTIAL/PLATELET
Abs Immature Granulocytes: 0.03 10*3/uL (ref 0.00–0.07)
Basophils Absolute: 0 10*3/uL (ref 0.0–0.1)
Basophils Relative: 0 %
Eosinophils Absolute: 0.1 10*3/uL (ref 0.0–0.5)
Eosinophils Relative: 1 %
HCT: 20.3 % — ABNORMAL LOW (ref 36.0–46.0)
Hemoglobin: 5.5 g/dL — ABNORMAL LOW (ref 12.0–15.0)
Immature Granulocytes: 0 %
Lymphocytes Relative: 19 %
Lymphs Abs: 1.7 10*3/uL (ref 0.7–4.0)
MCH: 22.5 pg — ABNORMAL LOW (ref 26.0–34.0)
MCHC: 27.1 g/dL — ABNORMAL LOW (ref 30.0–36.0)
MCV: 83.2 fL (ref 80.0–100.0)
Monocytes Absolute: 0.6 10*3/uL (ref 0.1–1.0)
Monocytes Relative: 7 %
Neutro Abs: 6.5 10*3/uL (ref 1.7–7.7)
Neutrophils Relative %: 73 %
Platelets: 428 10*3/uL — ABNORMAL HIGH (ref 150–400)
RBC: 2.44 MIL/uL — ABNORMAL LOW (ref 3.87–5.11)
RDW: 20.3 % — ABNORMAL HIGH (ref 11.5–15.5)
Smear Review: NORMAL
WBC: 8.9 10*3/uL (ref 4.0–10.5)
nRBC: 0 % (ref 0.0–0.2)

## 2019-05-10 LAB — GLUCOSE, CAPILLARY
Glucose-Capillary: 296 mg/dL — ABNORMAL HIGH (ref 70–99)
Glucose-Capillary: 175 mg/dL — ABNORMAL HIGH (ref 70–99)

## 2019-05-10 LAB — BASIC METABOLIC PANEL
Anion gap: 9 (ref 5–15)
BUN: 22 mg/dL (ref 8–23)
CO2: 22 mmol/L (ref 22–32)
Calcium: 9 mg/dL (ref 8.9–10.3)
Chloride: 106 mmol/L (ref 98–111)
Creatinine, Ser: 1.19 mg/dL — ABNORMAL HIGH (ref 0.44–1.00)
GFR calc Af Amer: 54 mL/min — ABNORMAL LOW (ref >60)
GFR calc non Af Amer: 46 mL/min — ABNORMAL LOW (ref >60)
Glucose, Bld: 338 mg/dL — ABNORMAL HIGH (ref 70–99)
Potassium: 4.5 mmol/L (ref 3.5–5.1)
Sodium: 137 mmol/L (ref 135–145)

## 2019-05-10 LAB — PATHOLOGIST SMEAR REVIEW

## 2019-05-10 LAB — ABO/RH: ABO/RH(D): B POS

## 2019-05-10 LAB — HEMOGLOBIN AND HEMATOCRIT, BLOOD
HCT: 19.3 % — ABNORMAL LOW (ref 36.0–46.0)
Hemoglobin: 5.3 g/dL — ABNORMAL LOW (ref 12.0–15.0)

## 2019-05-10 LAB — PREPARE RBC (CROSSMATCH)

## 2019-05-10 LAB — HEMOGLOBIN A1C
Hgb A1c MFr Bld: 5.9 % — ABNORMAL HIGH (ref 4.8–5.6)
Mean Plasma Glucose: 122.63 mg/dL

## 2019-05-10 LAB — HIV ANTIBODY (ROUTINE TESTING W REFLEX): HIV Screen 4th Generation wRfx: NONREACTIVE

## 2019-05-10 MED ORDER — MAGNESIUM OXIDE 400 (241.3 MG) MG PO TABS
400.0000 mg | ORAL_TABLET | Freq: Every day | ORAL | Status: DC
  Administered 2019-05-11 – 2019-05-12 (×2): 400 mg via ORAL
  Filled 2019-05-10 (×2): qty 1

## 2019-05-10 MED ORDER — SODIUM CHLORIDE 0.9% FLUSH
3.0000 mL | Freq: Two times a day (BID) | INTRAVENOUS | Status: DC
  Administered 2019-05-10 – 2019-05-12 (×4): 3 mL via INTRAVENOUS

## 2019-05-10 MED ORDER — INSULIN LISPRO 100 UNIT/ML ~~LOC~~ SOLN: 25.0000 [IU] | Freq: Three times a day (TID) | SUBCUTANEOUS | Status: DC

## 2019-05-10 MED ORDER — AMLODIPINE BESYLATE 5 MG PO TABS: 5.0000 mg | ORAL_TABLET | Freq: Every day | ORAL | Status: DC

## 2019-05-10 MED ORDER — SODIUM CHLORIDE 0.9 % IV SOLN: 10.0000 mL/h | Freq: Once | INTRAVENOUS | Status: DC

## 2019-05-10 MED ORDER — FERROUS SULFATE 325 (65 FE) MG PO TABS
325.0000 mg | ORAL_TABLET | Freq: Two times a day (BID) | ORAL | Status: DC
  Administered 2019-05-11 – 2019-05-12 (×2): 325 mg via ORAL
  Filled 2019-05-10 (×3): qty 1

## 2019-05-10 MED ORDER — PANTOPRAZOLE SODIUM 40 MG IV SOLR: 40.0000 mg | Freq: Two times a day (BID) | INTRAVENOUS | Status: DC

## 2019-05-10 MED ORDER — ACETAMINOPHEN 325 MG PO TABS: 650.0000 mg | ORAL_TABLET | Freq: Four times a day (QID) | ORAL | Status: DC | PRN

## 2019-05-10 MED ORDER — VITAMIN B-12 1000 MCG PO TABS
1000.0000 ug | ORAL_TABLET | ORAL | Status: DC
  Administered 2019-05-12: 1000 ug via ORAL
  Filled 2019-05-10 (×2): qty 1

## 2019-05-10 MED ORDER — INSULIN GLARGINE 100 UNIT/ML ~~LOC~~ SOLN
60.0000 [IU] | Freq: Every day | SUBCUTANEOUS | Status: DC
  Administered 2019-05-10 – 2019-05-11 (×2): 60 [IU] via SUBCUTANEOUS
  Filled 2019-05-10 (×3): qty 0.6

## 2019-05-10 MED ORDER — INSULIN ASPART 100 UNIT/ML ~~LOC~~ SOLN
0.0000 [IU] | Freq: Three times a day (TID) | SUBCUTANEOUS | Status: DC
  Administered 2019-05-10: 8 [IU] via SUBCUTANEOUS
  Administered 2019-05-11 – 2019-05-12 (×2): 3 [IU] via SUBCUTANEOUS
  Administered 2019-05-12: 2 [IU] via SUBCUTANEOUS
  Filled 2019-05-10 (×4): qty 1

## 2019-05-10 MED ORDER — INSULIN ASPART 100 UNIT/ML ~~LOC~~ SOLN
0.0000 [IU] | Freq: Every day | SUBCUTANEOUS | Status: DC
  Administered 2019-05-11: 2 [IU] via SUBCUTANEOUS
  Filled 2019-05-10: qty 1

## 2019-05-10 MED ORDER — SODIUM CHLORIDE 0.9 % IV SOLN
80.0000 mg | Freq: Once | INTRAVENOUS | Status: AC
  Administered 2019-05-10: 80 mg via INTRAVENOUS
  Filled 2019-05-10: qty 80

## 2019-05-10 MED ORDER — CO Q-10 100 MG PO CAPS: 100.0000 mg | ORAL_CAPSULE | Freq: Every day | ORAL | Status: DC

## 2019-05-10 MED ORDER — ATORVASTATIN CALCIUM 80 MG PO TABS
80.0000 mg | ORAL_TABLET | Freq: Every day | ORAL | Status: DC
  Administered 2019-05-11: 80 mg via ORAL
  Filled 2019-05-10: qty 1

## 2019-05-10 MED ORDER — VITAMIN B-6 50 MG PO TABS
100.0000 mg | ORAL_TABLET | Freq: Every day | ORAL | Status: DC
  Administered 2019-05-11 – 2019-05-12 (×2): 100 mg via ORAL
  Filled 2019-05-10 (×3): qty 2

## 2019-05-10 MED ORDER — CALCIUM CARBONATE-VITAMIN D 500-200 MG-UNIT PO TABS
1.0000 | ORAL_TABLET | Freq: Every day | ORAL | Status: DC
  Administered 2019-05-11 – 2019-05-12 (×2): 1 via ORAL
  Filled 2019-05-10 (×3): qty 1

## 2019-05-10 MED ORDER — DILTIAZEM HCL ER COATED BEADS 180 MG PO CP24
300.0000 mg | ORAL_CAPSULE | Freq: Every day | ORAL | Status: DC
  Filled 2019-05-10: qty 1

## 2019-05-10 MED ORDER — ACETAMINOPHEN 650 MG RE SUPP: 650.0000 mg | Freq: Four times a day (QID) | RECTAL | Status: DC | PRN

## 2019-05-10 MED ORDER — ONDANSETRON HCL 4 MG PO TABS: 4.0000 mg | ORAL_TABLET | Freq: Four times a day (QID) | ORAL | Status: DC | PRN

## 2019-05-10 MED ORDER — SODIUM CHLORIDE 0.9 % IV SOLN
8.0000 mg/h | INTRAVENOUS | Status: DC
  Administered 2019-05-10 – 2019-05-12 (×4): 8 mg/h via INTRAVENOUS
  Filled 2019-05-10: qty 40
  Filled 2019-05-10 (×3): qty 80

## 2019-05-10 MED ORDER — FOLIC ACID 1 MG PO TABS
1000.0000 ug | ORAL_TABLET | Freq: Every day | ORAL | Status: DC
  Administered 2019-05-11 – 2019-05-12 (×2): 1 mg via ORAL
  Filled 2019-05-10 (×2): qty 1

## 2019-05-10 MED ORDER — ONDANSETRON HCL 4 MG/2ML IJ SOLN: 4.0000 mg | Freq: Four times a day (QID) | INTRAMUSCULAR | Status: DC | PRN

## 2019-05-10 NOTE — ED Notes (Signed)
Pt provided with a warm blanket at this time. 

## 2019-05-10 NOTE — ED Triage Notes (Signed)
Pt states that she usually gets iron infusions in the cancer center. Last one was in November-December

## 2019-05-10 NOTE — H&P (Addendum)
TRH H&P   Patient Demographics:    Hannah Sullivan, is a 69 y.o. female  MRN: [id number]   DOB - May 19, 1950  Admit Date - 05/10/2019  Outpatient Primary MD for the patient is Perrin Maltese, MD  Referring MD: Dr. Archie Balboa  Outpatient Specialists: Hematology (Dr. Tasia Catchings), GI (Dr. [name])  Patient coming from: Home  Chief Complaint  Patient presents with  . Anemia      HPI:    Hannah Sullivan  is a 69 y.o. female, with history of iron deficiency anemia (recent baseline hemoglobin 9-10) with diagnosed AVMs (EGD, colonoscopy and capsule study done in 01/2018 showing some polyps and AVMs).  Capsule study result unable to me.  Patient also has uncontrolled diabetes mellitus on insulin with peripheral neuropathy and retinopathy, carotid artery disease, hypertension, hypothyroidism.  Patient follows with hematologist and was getting IV iron infusion periodically.  For reasons unclear she has not been getting her IV iron infusion for past 6-8 weeks.  (She reports that her hematologist has not scheduled any). She reports that for the past 3-4 weeks she has been feeling increasingly weak with progressive shortness of breath on exertion.  She reports that she has difficulty ambulating with shortness of breath and fatigue.  Denies any orthopnea or PND.  Denies any chest pain, palpitation, headache, dizziness, blurred vision, nausea, vomiting, abdominal pain, dysuria or diarrhea.  Reports that she does notice dark stool but this could be due to her iron intake.  Denies any fevers, chills, recent illness or sick contact or contact with COVID-19 patients.  Denies any fall, tingling or numbness of extremities. She went to her PCP office yesterday where she had hemoglobin checked and was 5.3.  She was then sent to the ED. In the ED she was mildly tachypneic.  Blood pressure and heart rate stable.  Blood work  showed hemoglobin of 5.5, platelets of 198.  Chemistry showed creatinine 1.19. Patient ordered for 2 unit PRBC and hospitalist consulted for observation.       Review of systems:    In addition to the HPI above,  No Fever-chills, No Headache, No changes with Vision or hearing, No problems swallowing food or Liquids, No Chest pain, Cough.  Shortness of breath on exertion + + No Abdominal pain, No Nausea or vomiting, Bowel movements are regular, No Blood in stool or Urine, No dysuria, No new skin rashes or bruises, No new joints pains-aches,  Generalized weakness + +, tingling, numbness in any extremity, No recent weight gain or loss, No polyuria, polydypsia or polyphagia, No significant Mental Stressors.     With Past History of the following :    Past Medical History:  Diagnosis Date  . Anemia   . Arthritis   . Carotid disease, bilateral ([place])   . Diabetes mellitus without complication ([place])   . Diabetic neuropathy ([place])   .  Diabetic retinopathy ([place])   . Dyspnea   . Hyperlipidemia   . Hypertension   . Hypothyroidism   . Microalbuminuria   . Onychomycosis   . Personal history of tobacco use, presenting hazards to health 10/10/2014  . Protein in urine   . Pyloric ulcer associated with Helicobacter pylori   . Spinal stenosis   . Thyroid disease 1980's   treated with radioactive iodine  . Tremors of nervous system       Past Surgical History:  Procedure Laterality Date  . ABDOMINAL HYSTERECTOMY  1196   Dr [facility], [place], [place] Dr Shana Chute  . CESAREAN SECTION  1971/1978   2 c-sections, Dr [facility], [place], [place]  . COLONOSCOPY WITH PROPOFOL N/A 01/05/2018   Procedure: COLONOSCOPY WITH PROPOFOL;  Surgeon: Toledo, Benay Pike, MD;  Location: [facility] ENDOSCOPY;  Service: Gastroenterology;  Laterality: N/A;  . DOPPLER ECHOCARDIOGRAPHY  02/2012   Left Corptid Artery-neck, Dr Chryl Heck [facility]  . endoscope  2009   Dr Maceo Pro, [facility]    . ESOPHAGOGASTRODUODENOSCOPY (EGD) WITH PROPOFOL N/A 01/05/2018   Procedure: ESOPHAGOGASTRODUODENOSCOPY (EGD) WITH PROPOFOL;  Surgeon: Toledo, Benay Pike, MD;  Location: [facility] ENDOSCOPY;  Service: Gastroenterology;  Laterality: N/A;  . SHOULDER ARTHROSCOPY WITH ROTATOR CUFF REPAIR Right 10/20/2016   Procedure: SHOULDER ARTHROSCOPY WITH OPEN ROTATOR CUFF REPAIR;  Surgeon: Thornton Park, MD;  Location: [facility] ORS;  Service: Orthopedics;  Laterality: Right;  . TONSILECTOMY/ADENOIDECTOMY WITH MYRINGOTOMY  1961  . TONSILLECTOMY    . [place]      Social History:     Social History   Tobacco Use  . Smoking status: Former Smoker    Packs/day: 0.50    Years: 45.00    Pack years: 22.50    Types: Cigarettes    Quit date: 06/09/2018    Years since quitting: 0.9  . Smokeless tobacco: Never Used  Substance Use Topics  . Alcohol use: No    Comment: occasionally     Lives -home  Mobility -independent   Family History :     Family History  Problem Relation Age of Onset  . Osteoporosis Mother   . Heart attack Sister   . Diabetes Sister   . Diabetes Father   . Heart attack Sister   . Stroke Sister   . Deep vein thrombosis Sister   . Breast cancer Neg Hx       Home Medications:   Prior to Admission medications   Medication Sig Start Date End Date Taking? Authorizing Provider  amLODipine (NORVASC) 5 MG tablet Take 5 mg by mouth daily. 05/09/19  Yes [provider]  aspirin EC 81 MG tablet Take 81 mg by mouth daily.   Yes [provider]  atorvastatin (LIPITOR) 80 MG tablet Take 80 mg by mouth daily at 6 PM.    Yes [provider]  Calcium Carbonate-Vitamin D 600-400 MG-UNIT tablet Take by mouth. Take 1 tablet by mouth 2 (two) times daily with meals   Yes [provider]  calcium-vitamin D (OSCAL WITH D) 500-200 MG-UNIT tablet Take 1 tablet by mouth daily.   Yes [provider]   Cholecalciferol (VITAMIN D3) 50000 units CAPS Take 1 capsule by mouth once a week. 10/16/17  Yes [provider]  Coenzyme Q10 (CO Q-10) 100 MG CAPS Take 100 mg by mouth daily.    Yes [provider]  diltiazem (CARDIZEM CD) 300 MG 24 hr  capsule Take 300 mg by mouth daily.  09/16/18  Yes [provider]  ferrous sulfate 325 (65 FE) MG tablet Take 325 mg by mouth 2 (two) times daily with a meal.    Yes [provider]  folic acid (FOLVITE) A999333 MCG tablet Take 400 mcg by mouth daily.   Yes [provider]  hydrochlorothiazide (HYDRODIURIL) 25 MG tablet Take 25 mg by mouth every morning.    Yes [provider]  Insulin Glargine (BASAGLAR KWIKPEN [place]) Inject 0-100 Units into the skin daily.    Yes [provider]  Insulin Glargine (LANTUS SOLOSTAR) 100 UNIT/ML Solostar Pen 60 Units at bedtime.  07/02/17  Yes [provider]  insulin lispro (HUMALOG) 100 UNIT/ML injection Inject 25 Units into the skin 3 (three) times daily before meals.   Yes [provider]  losartan (COZAAR) 100 MG tablet Take 100 mg by mouth daily. In am.   Yes [provider]  Magnesium 250 MG TABS Take 250 mg by mouth daily.   Yes [provider]  metFORMIN (GLUCOPHAGE) 1000 MG tablet Take 1,000 mg by mouth 2 (two) times daily after a meal.    Yes [provider]  pyridOXINE (VITAMIN B-6) 100 MG tablet Take 100 mg by mouth daily.   Yes [provider]  vitamin B-12 (CYANOCOBALAMIN) 1000 MCG tablet Take 1,000 mcg by mouth every other day.   Yes [provider]  Vitamin D, Ergocalciferol, (DRISDOL) 1.25 MG (50000 UNIT) CAPS capsule Take 50,000 Units by mouth every 7 (seven) days.   Yes [provider]     Allergies:    No Known Allergies   Physical Exam:   Vitals  Blood pressure (!) 179/61, pulse 92, temperature 98.2 F (36.8 C), temperature source Oral, resp. rate (!) 23, height 5\' 1"  (1.549  m), weight 71.2 kg, SpO2 98 %.   General: Elderly female lying in bed in no acute distress HEENT: Pupils reactive bilaterally, EOMI, pallor present, no icterus, moist mucosa, supple neck, no cervical pain with the Chest: Clear to auscultation bilaterally, no added sound CVS: Normal S1-S2, no murmurs or gallop GI: Soft, nondistended, nontender, bowel sounds present Musculoskeletal: Warm, no edema CNS: Alert and oriented, nonfocal   Data Review:    CBC Recent Labs  Lab 05/10/19 1306  WBC 8.9  HGB 5.5*  HCT 20.3*  PLT 428*  MCV 83.2  MCH 22.5*  MCHC 27.1*  RDW 20.3*  LYMPHSABS 1.7  MONOABS 0.6  EOSABS 0.1  BASOSABS 0.0   ------------------------------------------------------------------------------------------------------------------  Chemistries  Recent Labs  Lab 05/10/19 1306  NA 137  K 4.5  CL 106  CO2 22  GLUCOSE 338*  BUN 22  CREATININE 1.19*  CALCIUM 9.0   ------------------------------------------------------------------------------------------------------------------ estimated creatinine clearance is 39.7 mL/min (A) (by C-G formula based on SCr of 1.19 mg/dL (H)). ------------------------------------------------------------------------------------------------------------------ No results for input(s): TSH, T4TOTAL, T3FREE, THYROIDAB in the last 72 hours.  Invalid input(s): FREET3  Coagulation profile No results for input(s): INR, PROTIME in the last 168 hours. ------------------------------------------------------------------------------------------------------------------- No results for input(s): DDIMER in the last 72 hours. -------------------------------------------------------------------------------------------------------------------  Cardiac Enzymes No results for input(s): CKMB, TROPONINI, MYOGLOBIN in the last 168 hours.  Invalid input(s):  CK ------------------------------------------------------------------------------------------------------------------ No results found for: BNP   ---------------------------------------------------------------------------------------------------------------  Urinalysis No results found for: COLORURINE, APPEARANCEUR, LABSPEC, [place], GLUCOSEU, HGBUR, BILIRUBINUR, KETONESUR, PROTEINUR, UROBILINOGEN, NITRITE, LEUKOCYTESUR  ----------------------------------------------------------------------------------------------------------------   Imaging Results:    No results found.  My personal review of EKG: Sinus tachycardia  at 103, LVH with T wave inversion in lateral leads.  Assessment & Plan:    Active Problems: Symptomatic anemia Secondary to iron deficiency.  Cannot rule out underlying GI bleed with history of AVMs. Type and screen and 2 unit PRBC ordered in the ED.  Monitor serial H&H. Placed on PPI drip in the ED.  Given her progressive symptom for several weeks I suspect this is acute GI bleed. GI consulted (Dr. Vicente Males) who will see her.  Clear liquid for now and n.p.o. after midnight.  Active symptoms   Type 2 diabetes mellitus, uncontrolled ([place]) Resume her Lantus and monitor on sliding scale coverage.  Essential hypertension Stable.  Resume home meds  B12 deficiency Continue supplement  Dyslipidemia Continue statin   COVID-19 PCR pending.      DVT Prophylaxis: SCD  AM Labs Ordered, also please review Full Orders  Family Communication: Admission, patients condition and plan of care including tests being ordered have been discussed with the patient at bedside  Code Status full code  Likely DC to home  Condition fair  Consults called: GI (Dr. Vicente Males)  Admission status: Observation  Time spent in minutes : 50   Ordean Fouts M.D on 05/10/2019 at 4:06 PM  Between 7am to 7pm - Pager - [phone]. After 7pm go to [email or web address] - password [facility]  [facility] - Office  [phone]

## 2019-05-10 NOTE — Progress Notes (Signed)
Patient has home medications at bedside. Refusing to let me take them and lock up in the pharmacy. Tried to educate patient on why we lock them up however patient still refuses. Instructed patient to make staff aware if she does take medication for safety

## 2019-05-10 NOTE — ED Notes (Signed)
This RN transported to pt to room 248.

## 2019-05-10 NOTE — ED Notes (Signed)
Pt provided with lunch tray/fluids by this RN.

## 2019-05-10 NOTE — ED Notes (Signed)
This RN in to address call light; pt inquiring about taking home medications that she has not taken today; verbal ok provided per EDP, Archie Balboa.  Pt made aware.

## 2019-05-10 NOTE — ED Triage Notes (Signed)
Pt called from MD office and told to come to the ED for a blood transfusion. Pt sent from [facility] with a hbg of 5.3. pt denies pain or other sx's, states she is tired.

## 2019-05-10 NOTE — ED Notes (Addendum)
Pt reports dizziness when getting up from bed/sofa for 1.5 months.  Pt denies CP/SHOb at this time.  Pt laying in bed comfortably. RR even and unlabored. Pt speaking in complete sentences. A/Ox4 at this time. NAD noted upon assessment. EDP Archie Balboa at bedside.

## 2019-05-10 NOTE — ED Notes (Signed)
This Rn offered to contact family to provide status update at this time. Pt st "I do not feel like talking to anybody. No need to call".

## 2019-05-10 NOTE — ED Notes (Signed)
This RN in to draw repeat Type & Screen per primary RN request.  Pt refuses to let me stick her, states, "If you can't find no vein, I don't need no damage to my hand, I have enough nerve damage already."  Primary RN, Carney Harder made aware.

## 2019-05-10 NOTE — ED Provider Notes (Signed)
[facility] Emergency Department Provider Note  ____________________________________________   I have reviewed the triage vital signs and the nursing notes.   HISTORY  Chief Complaint Anemia   History limited by: Not Limited   HPI Hannah Sullivan is a 69 y.o. female who presents to the emergency department today at the advice of her doctors office because of concern for anemia found on blood work yesterday. The patient went to her doctors because she has noticed over the past few weeks increasing weakness. She states it is hard for her to do her daily chores. She has had some associated shortness of breath. The patient states that she has had issues with anemia in the past. Has had iron transfusion. Was told she has AVM in her GI tract. Has had black and tarry stool.    Records reviewed. Per medical record review patient has a history of anemia, avm.   Past Medical History:  Diagnosis Date  . Anemia   . Arthritis   . Carotid disease, bilateral ([place])   . Diabetes mellitus without complication ([place])   . Diabetic neuropathy ([place])   . Diabetic retinopathy ([place])   . Dyspnea   . Hyperlipidemia   . Hypertension   . Hypothyroidism   . Microalbuminuria   . Onychomycosis   . Personal history of tobacco use, presenting hazards to health 10/10/2014  . Protein in urine   . Pyloric ulcer associated with Helicobacter pylori   . Spinal stenosis   . Thyroid disease 1980's   treated with radioactive iodine  . Tremors of nervous system     Patient Active Problem List   Diagnosis Date Noted  . AVM (arteriovenous malformation) of small bowel, acquired 04/07/2018  . Tubular adenoma 04/07/2018  . Iron deficiency anemia 12/08/2017  . Low back pain 12/22/2016  . Full thickness rotator cuff tear 09/24/2016  . Health care maintenance 08/07/2015  . 2-vessel coronary artery disease 12/31/2014  . Personal history of tobacco use, presenting hazards to health 10/10/2014  .  Personal history of nicotine dependence 10/10/2014  . Background retinopathy 01/19/2014  . Proteinuria 01/19/2014  . Tobacco dependence 01/19/2014  . Type 2 diabetes mellitus ([place]) 12/15/2013  . Bilateral carotid artery disease ([place]) 12/15/2013  . Essential tremor 12/15/2013  . HTN (hypertension), benign 12/15/2013  . Hyperlipidemia associated with type 2 diabetes mellitus ([place]) 12/15/2013  . Spinal stenosis, lumbar region without neurogenic claudication 12/15/2013    Past Surgical History:  Procedure Laterality Date  . ABDOMINAL HYSTERECTOMY  1196   Dr [facility], [place], [place] Dr Shana Chute  . CESAREAN SECTION  1971/1978   2 c-sections, Dr [facility], [place], [place]  . COLONOSCOPY WITH PROPOFOL N/A 01/05/2018   Procedure: COLONOSCOPY WITH PROPOFOL;  Surgeon: Toledo, Benay Pike, MD;  Location: [facility] ENDOSCOPY;  Service: Gastroenterology;  Laterality: N/A;  . DOPPLER ECHOCARDIOGRAPHY  02/2012   Left Corptid Artery-neck, Dr [facility]  . endoscope  2009   Dr Maceo Pro, [facility], [place]  . ESOPHAGOGASTRODUODENOSCOPY (EGD) WITH PROPOFOL N/A 01/05/2018   Procedure: ESOPHAGOGASTRODUODENOSCOPY (EGD) WITH PROPOFOL;  Surgeon: Toledo, Benay Pike, MD;  Location: [facility] ENDOSCOPY;  Service: Gastroenterology;  Laterality: N/A;  . SHOULDER ARTHROSCOPY WITH ROTATOR CUFF REPAIR Right 10/20/2016   Procedure: SHOULDER ARTHROSCOPY WITH OPEN ROTATOR CUFF REPAIR;  Surgeon: Thornton Park, MD;  Location: [facility] ORS;  Service: Orthopedics;  Laterality: Right;  . TONSILECTOMY/ADENOIDECTOMY WITH MYRINGOTOMY  1961  . TONSILLECTOMY    . [place]  [place]    Prior to Admission medications   Medication Sig Start Date End Date Taking? Authorizing Provider  aspirin EC 81 MG tablet Take 81 mg by mouth daily.    [provider]  atorvastatin (LIPITOR) 40 MG tablet Take 20 mg by mouth daily at 6 PM.     [provider]  Calcium  Carbonate-Vitamin D 600-400 MG-UNIT tablet Take by mouth. Take 1 tablet by mouth 2 (two) times daily with meals    [provider]  Cholecalciferol (VITAMIN D3) 50000 units CAPS Take 1 capsule by mouth once a week. 10/16/17   [provider]  Coenzyme Q10 (CO Q-10) 200 MG CAPS Take 200 mg by mouth daily.    [provider]  diltiazem (CARDIZEM CD) 300 MG 24 hr capsule Take 300 mg by mouth daily.  09/16/18   [provider]  ferrous sulfate 325 (65 FE) MG tablet Take 325 mg by mouth 2 (two) times daily with a meal.     [provider]  folic acid (FOLVITE) [id number] MCG tablet Take 400 mcg by mouth 2 (two) times daily.    [provider]  hydrochlorothiazide (HYDRODIURIL) 25 MG tablet Take 25 mg by mouth every morning.     [provider]  Insulin Glargine (LANTUS SOLOSTAR) 100 UNIT/ML Solostar Pen 60 Units at bedtime.  07/02/17   [provider]  insulin lispro (HUMALOG) 100 UNIT/ML injection Inject 25 Units into the skin 3 (three) times daily before meals.    [provider]  losartan (COZAAR) 100 MG tablet Take 100 mg by mouth daily. In am.    [provider]  MAGNESIUM OXIDE 400 PO Take by mouth daily.    [provider]  metFORMIN (GLUCOPHAGE) 1000 MG tablet Take 1,000 mg by mouth 2 (two) times daily after a meal.     [provider]  Multiple Vitamins-Minerals (PRESERVISION AREDS 2 PO) Take 1 tablet by mouth daily at 3 pm.    [provider]  nicotine polacrilex (NICORETTE) 2 MG gum Take by mouth. Take 2 mg by mouth as needed for Smoking cessation Chew the gum until it tingles and then park it between your cheek and gum. Repeat    [provider]  Pyridoxine HCl (VITAMIN B-6 PO) Take by mouth.    [provider]    Allergies Patient has no known allergies.  Family History  Problem Relation Age of Onset  . Osteoporosis Mother   . Heart attack Sister   . Diabetes  Sister   . Diabetes Father   . Heart attack Sister   . Stroke Sister   . Deep vein thrombosis Sister   . Breast cancer Neg Hx     Social History Social History   Tobacco Use  . Smoking status: Former Smoker    Packs/day: 0.50    Years: 45.00    Pack years: 22.50    Types: Cigarettes    Quit date: 06/09/2018    Years since quitting: 0.9  . Smokeless tobacco: Never Used  Substance Use Topics  . Alcohol use: No    Comment: occasionally  . Drug use: No    Review of Systems Constitutional: No fever/chills Eyes: No visual changes. ENT: No sore throat. Cardiovascular: Denies chest pain. Respiratory:Positive for shortness of breatshortness of breath. Gastrointestinal: No abdominal pain.  No nausea, no vomiting.  No diarrhea.   Genitourinary: Negative for dysuria. Musculoskeletal: Negative for back pain. Skin: Negative  for rash. Neurological: Negative for headaches, focal weakness or numbness.  ____________________________________________   PHYSICAL EXAM:  VITAL SIGNS: ED Triage Vitals [05/10/19 1254]  Enc Vitals Group     BP      Pulse      Resp      Temp      Temp src      SpO2      Weight 157 lb (71.2 kg)     Height 5\' 1"  (1.549 m)     Head Circumference      Peak Flow      Pain Score 0   Constitutional: Alert and oriented.  Eyes: Conjunctivae are normal.  ENT      Head: Normocephalic and atraumatic.      Nose: No congestion/rhinnorhea.      Mouth/Throat: Mucous membranes are moist.      Neck: No stridor. Cardiovascular: Tachycardic, regular rhythm.   Respiratory: Normal respiratory effort without tachypnea nor retractions. Breath sounds are clear and equal bilaterally. No wheezes/rales/rhonchi. Gastrointestinal: Soft and non tender. No rebound. No guarding.  Genitourinary: Deferred Musculoskeletal: Normal range of motion in all extremities. No lower extremity edema. Neurologic:  Normal speech and language. No gross focal neurologic deficits are  appreciated.  Skin:  Skin is warm, dry and intact. No rash noted. Psychiatric: Mood and affect are normal. Speech and behavior are normal. Patient exhibits appropriate insight and judgment.  ____________________________________________    LABS (pertinent positives/negatives)  BMP na 137, k 4.5, glu 338, cr 1.19 CBC wbc 8.9, hgb 5.5, plt 428 ____________________________________________   EKG  I, Nance Pear, attending physician, personally viewed and interpreted this EKG  EKG Time: 1309 Rate: 103 Rhythm: sinus tachycardia Axis: normal Intervals: qtc 419 QRS: narrow ST changes: no st elevation Impression: abnormal ekg ____________________________________________    RADIOLOGY  None  ____________________________________________   PROCEDURES  Procedures  ____________________________________________   INITIAL IMPRESSION / ASSESSMENT AND PLAN / ED COURSE  Pertinent labs & imaging results that were available during my care of the patient were reviewed by me and considered in my medical decision making (see chart for details).   Patient presents to the emergency department because of concern for anemia. Sent by PCP office because of anemia found on blood work drawn yesterday. Patient has history of anemia as well as AVM. Has had black stools. Slightly tachycardic on exam. Did discuss blood transfusion with the patient. Will plan on admission. Will start protonix as well.    ____________________________________________   FINAL CLINICAL IMPRESSION(S) / ED DIAGNOSES  Final diagnoses:  Anemia, unspecified type     Note: This dictation was prepared with Dragon dictation. Any transcriptional errors that result from this process are unintentional     Nance Pear, MD 05/10/19 2000

## 2019-05-10 NOTE — ED Notes (Signed)
This Rn attempted to provide report to assigned room 248 unsuccessfully. Per unit secretary this Rn will have to call back on 10 minutes.

## 2019-05-10 NOTE — Progress Notes (Signed)
PHARMACIST - PHYSICIAN ORDER COMMUNICATION  CONCERNING: P&T Medication Policy on Herbal Medications  DESCRIPTION:  This patient's order for:  Co-Q capsules  has been noted.  This product(s) is classified as an "herbal" or natural product. Due to a lack of definitive safety studies or FDA approval, nonstandard manufacturing practices, plus the potential risk of unknown drug-drug interactions while on inpatient medications, the Pharmacy and Therapeutics Committee does not permit the use of "herbal" or natural products of this type within [facility].   ACTION TAKEN: The pharmacy department is unable to verify this order at this time and your patient has been informed of this safety policy. Please reevaluate patient's clinical condition at discharge and address if the herbal or natural product(s) should be resumed at that time.  Pearla Dubonnet, PharmD Clinical Pharmacist 05/10/2019 4:58 PM

## 2019-05-10 NOTE — ED Notes (Signed)
Pt st taking her home medication to include; atorvastatin and diltiazem.

## 2019-05-11 DIAGNOSIS — E1169 Type 2 diabetes mellitus with other specified complication: Secondary | ICD-10-CM | POA: Diagnosis present

## 2019-05-11 DIAGNOSIS — Z8262 Family history of osteoporosis: Secondary | ICD-10-CM | POA: Diagnosis not present

## 2019-05-11 DIAGNOSIS — I959 Hypotension, unspecified: Secondary | ICD-10-CM | POA: Diagnosis present

## 2019-05-11 DIAGNOSIS — D509 Iron deficiency anemia, unspecified: Secondary | ICD-10-CM | POA: Diagnosis present

## 2019-05-11 DIAGNOSIS — E538 Deficiency of other specified B group vitamins: Secondary | ICD-10-CM | POA: Diagnosis present

## 2019-05-11 DIAGNOSIS — E11319 Type 2 diabetes mellitus with unspecified diabetic retinopathy without macular edema: Secondary | ICD-10-CM | POA: Diagnosis present

## 2019-05-11 DIAGNOSIS — K5520 Angiodysplasia of colon without hemorrhage: Secondary | ICD-10-CM | POA: Diagnosis not present

## 2019-05-11 DIAGNOSIS — Z794 Long term (current) use of insulin: Secondary | ICD-10-CM | POA: Diagnosis not present

## 2019-05-11 DIAGNOSIS — Z8249 Family history of ischemic heart disease and other diseases of the circulatory system: Secondary | ICD-10-CM | POA: Diagnosis not present

## 2019-05-11 DIAGNOSIS — E039 Hypothyroidism, unspecified: Secondary | ICD-10-CM | POA: Diagnosis present

## 2019-05-11 DIAGNOSIS — E1165 Type 2 diabetes mellitus with hyperglycemia: Secondary | ICD-10-CM | POA: Diagnosis present

## 2019-05-11 DIAGNOSIS — Z7982 Long term (current) use of aspirin: Secondary | ICD-10-CM | POA: Diagnosis not present

## 2019-05-11 DIAGNOSIS — D649 Anemia, unspecified: Secondary | ICD-10-CM

## 2019-05-11 DIAGNOSIS — Z6829 Body mass index (BMI) 29.0-29.9, adult: Secondary | ICD-10-CM | POA: Diagnosis not present

## 2019-05-11 DIAGNOSIS — Z833 Family history of diabetes mellitus: Secondary | ICD-10-CM | POA: Diagnosis not present

## 2019-05-11 DIAGNOSIS — D62 Acute posthemorrhagic anemia: Secondary | ICD-10-CM | POA: Diagnosis present

## 2019-05-11 DIAGNOSIS — Z20822 Contact with and (suspected) exposure to covid-19: Secondary | ICD-10-CM | POA: Diagnosis present

## 2019-05-11 DIAGNOSIS — I10 Essential (primary) hypertension: Secondary | ICD-10-CM | POA: Diagnosis present

## 2019-05-11 DIAGNOSIS — Z8711 Personal history of peptic ulcer disease: Secondary | ICD-10-CM | POA: Diagnosis not present

## 2019-05-11 DIAGNOSIS — D519 Vitamin B12 deficiency anemia, unspecified: Secondary | ICD-10-CM | POA: Diagnosis present

## 2019-05-11 DIAGNOSIS — K31811 Angiodysplasia of stomach and duodenum with bleeding: Secondary | ICD-10-CM | POA: Diagnosis present

## 2019-05-11 DIAGNOSIS — I2510 Atherosclerotic heart disease of native coronary artery without angina pectoris: Secondary | ICD-10-CM | POA: Diagnosis present

## 2019-05-11 DIAGNOSIS — E785 Hyperlipidemia, unspecified: Secondary | ICD-10-CM | POA: Diagnosis present

## 2019-05-11 DIAGNOSIS — E663 Overweight: Secondary | ICD-10-CM | POA: Diagnosis present

## 2019-05-11 DIAGNOSIS — E1142 Type 2 diabetes mellitus with diabetic polyneuropathy: Secondary | ICD-10-CM | POA: Diagnosis present

## 2019-05-11 DIAGNOSIS — Z87891 Personal history of nicotine dependence: Secondary | ICD-10-CM | POA: Diagnosis not present

## 2019-05-11 LAB — BASIC METABOLIC PANEL
Anion gap: 7 (ref 5–15)
BUN: 20 mg/dL (ref 8–23)
CO2: 22 mmol/L (ref 22–32)
Calcium: 8.4 mg/dL — ABNORMAL LOW (ref 8.9–10.3)
Chloride: 109 mmol/L (ref 98–111)
Creatinine, Ser: 0.91 mg/dL (ref 0.44–1.00)
GFR calc Af Amer: >60 mL/min (ref >60)
GFR calc non Af Amer: >60 mL/min (ref >60)
Glucose, Bld: 110 mg/dL — ABNORMAL HIGH (ref 70–99)
Potassium: 4.2 mmol/L (ref 3.5–5.1)
Sodium: 138 mmol/L (ref 135–145)

## 2019-05-11 LAB — BPAM RBC
Blood Product Expiration Date: 202103192359
Blood Product Expiration Date: 202103192359
ISSUE DATE / TIME: 202102171809
ISSUE DATE / TIME: 202102172107
Unit Type and Rh: 7300
Unit Type and Rh: 7300

## 2019-05-11 LAB — IRON AND TIBC
Iron: 11 ug/dL — ABNORMAL LOW (ref 28–170)
Saturation Ratios: 3 % — ABNORMAL LOW (ref 10.4–31.8)
TIBC: 414 ug/dL (ref 250–450)
UIBC: 403 ug/dL

## 2019-05-11 LAB — TYPE AND SCREEN
ABO/RH(D): B POS
Antibody Screen: NEGATIVE
Unit division: 0
Unit division: 0

## 2019-05-11 LAB — HEMOGLOBIN AND HEMATOCRIT, BLOOD
HCT: 27 % — ABNORMAL LOW (ref 36.0–46.0)
HCT: 26.1 % — ABNORMAL LOW (ref 36.0–46.0)
Hemoglobin: 8.1 g/dL — ABNORMAL LOW (ref 12.0–15.0)
Hemoglobin: 8 g/dL — ABNORMAL LOW (ref 12.0–15.0)

## 2019-05-11 LAB — FERRITIN: Ferritin: 9 ng/mL — ABNORMAL LOW (ref 11–307)

## 2019-05-11 LAB — SARS CORONAVIRUS 2 (TAT 6-24 HRS): SARS Coronavirus 2: NEGATIVE

## 2019-05-11 LAB — GLUCOSE, CAPILLARY
Glucose-Capillary: 122 mg/dL — ABNORMAL HIGH (ref 70–99)
Glucose-Capillary: 115 mg/dL — ABNORMAL HIGH (ref 70–99)
Glucose-Capillary: 163 mg/dL — ABNORMAL HIGH (ref 70–99)
Glucose-Capillary: 230 mg/dL — ABNORMAL HIGH (ref 70–99)

## 2019-05-11 LAB — VITAMIN B12: Vitamin B-12: 633 pg/mL (ref 180–914)

## 2019-05-11 LAB — FOLATE: Folate: 17.5 ng/mL (ref >5.9)

## 2019-05-11 NOTE — Progress Notes (Addendum)
Progress Note    Hannah Sullivan  [id number] DOB: [birth year]  DOA: 05/10/2019 PCP: Perrin Maltese, MD      Brief Narrative:    Medical records reviewed and are as summarized below:  Hannah Sullivan is an 69 y.o. female with medical history significant for diabetes mellitus with peripheral neuropathy and retinopathy, carotid artery disease, hypothyroidism, CAD, iron deficiency anemia, vitamin B12 deficiency anemia, GI bleed, small bowel AVMs (EGD, colonoscopy and capsule endoscopy in November 2019 showed polyps and small bowel AVMs).  She was supposed to follow-up with [place] [facility] gastro[facility]ology for balloon enteroscopy but she has not been able to follow-up.  She used to get IV iron infusion for iron deficiency anemia.  She said she had not received any for about 2 months now.  She presented to the hospital for increasing generalized weakness, shortness of breath and easy fatigability.  She went to her PCP the day prior to admission and her hemoglobin was 5.3.  She was subsequently referred to the emergency room for further evaluation.  In the ED, her hemoglobin was 5.5      Assessment/Plan:   Active Problems:   Iron deficiency anemia   Type 2 diabetes mellitus (HCC)   2-vessel coronary artery disease   AVM (arteriovenous malformation) of small bowel, acquired   HTN (hypertension), benign   Hyperlipidemia associated with type 2 diabetes mellitus (HCC)   Tobacco dependence   Symptomatic anemia   Severe anemia/symptomatic anemia: Status post transfusion with 2 units of packed red blood cells on 05/10/2019.  Hemoglobin up from 5.3-8.0.  Monitor H&H.  History of GI bleed/small bowel AVMs: Patient was seen by the gastroenterologist.  She is undecided about endoscopic work-up at this time.  Of note, patient was supposed to follow-up [facility] gastroenterology for balloon enteroscopy but she did not show up for the procedure.  Continue IV Protonix for now.  Hypotension: Hold amlodipine,  Cardizem, HCTZ and losartan.  Monitor BP closely  Insulin-dependent diabetes mellitus: continue Lantus, NovoLog as needed for hyperglycemia.  Monitor glucose levels.  Patient was strongly advised not to take her oral medications without approval from the hospital staff/physicians.   Body mass index is 29.66 kg/m.  (Overweight)   Family Communication/Anticipated D/C date and plan/Code Status   DVT prophylaxis: SCDs Code Status: Full code Family Communication: Plan discussed with patient Disposition Plan: Possible discharge to home tomorrow pending possible endoscopic work-up.      Subjective:   Overnight events noted.  No bloody stools, abdominal pain or hematemesis.  No dizziness, chest pain or shortness of breath.  She said she is hungry and wants to eat.  Patient did not want her home medicines to be taken to pharmacy and wanted to take them on her own without approval from the hospital staff.  Objective:    Vitals:   05/11/19 0008 05/11/19 0443 05/11/19 0550 05/11/19 0746  BP: (!) 116/44 (!) 82/51 (!) 105/57 (!) 95/59  Pulse: 70 70 67 64  Resp:  16  17  Temp: 98.5 F (36.9 C) 98.2 F (36.8 C)  98.3 F (36.8 C)  TempSrc: Oral Oral  Oral  SpO2: 100% 99%  100%  Weight:      Height:        Intake/Output Summary (Last 24 hours) at 05/11/2019 1337 Last data filed at 05/11/2019 1015 Gross per 24 hour  Intake 821.14 ml  Output 0 ml  Net 821.14 ml   Filed Weights   05/10/19 1254  Weight: 71.2 kg    Exam:  GEN: NAD SKIN: No rash EYES: EOMI ENT: MMM CV: RRR PULM: CTA B ABD: soft, ND, NT, +BS CNS: AAO x 3, non focal EXT: No edema or tenderness   Data Reviewed:   I have personally reviewed following labs and imaging studies:  Labs: Labs show the following:   Basic Metabolic Panel: Recent Labs  Lab 05/10/19 1306 05/11/19 0509  NA 137 138  K 4.5 4.2  CL 106 109  CO2 22 22  GLUCOSE 338* 110*  BUN 22 20  CREATININE 1.19* 0.91  CALCIUM 9.0 8.4*     GFR Estimated Creatinine Clearance: 51.9 mL/min (by C-G formula based on SCr of 0.91 mg/dL). Liver Function Tests: No results for input(s): AST, ALT, ALKPHOS, BILITOT, PROT, ALBUMIN in the last 168 hours. No results for input(s): LIPASE, AMYLASE in the last 168 hours. No results for input(s): AMMONIA in the last 168 hours. Coagulation profile No results for input(s): INR, PROTIME in the last 168 hours.  CBC: Recent Labs  Lab 05/10/19 1306 05/10/19 1653 05/11/19 0031 05/11/19 0509  WBC 8.9  --   --   --   NEUTROABS 6.5  --   --   --   HGB 5.5* 5.3* 8.1* 8.0*  HCT 20.3* 19.3* 27.0* 26.1*  MCV 83.2  --   --   --   PLT 428*  --   --   --    Cardiac Enzymes: No results for input(s): CKTOTAL, CKMB, CKMBINDEX, TROPONINI in the last 168 hours. BNP (last 3 results) No results for input(s): PROBNP in the last 8760 hours. CBG: Recent Labs  Lab 05/10/19 1806 05/10/19 2205 05/11/19 0746 05/11/19 1152  GLUCAP 296* 175* 122* 115*   D-Dimer: No results for input(s): DDIMER in the last 72 hours. Hgb A1c: Recent Labs    05/10/19 1653  HGBA1C 5.9*   Lipid Profile: No results for input(s): CHOL, HDL, LDLCALC, TRIG, CHOLHDL, LDLDIRECT in the last 72 hours. Thyroid function studies: No results for input(s): TSH, T4TOTAL, T3FREE, THYROIDAB in the last 72 hours.  Invalid input(s): FREET3 Anemia work up: No results for input(s): VITAMINB12, FOLATE, FERRITIN, TIBC, IRON, RETICCTPCT in the last 72 hours. Sepsis Labs: Recent Labs  Lab 05/10/19 1306  WBC 8.9    Microbiology Recent Results (from the past 240 hour(s))  SARS CORONAVIRUS 2 (TAT 6-24 HRS) Nasopharyngeal Nasopharyngeal Swab     Status: None   Collection Time: 05/10/19  2:37 PM   Specimen: Nasopharyngeal Swab  Result Value Ref Range Status   SARS Coronavirus 2 NEGATIVE NEGATIVE Final    Comment: (NOTE) SARS-CoV-2 target nucleic acids are NOT DETECTED. The SARS-CoV-2 RNA is generally detectable in upper and  lower respiratory specimens during the acute phase of infection. Negative results do not preclude SARS-CoV-2 infection, do not rule out co-infections with other pathogens, and should not be used as the sole basis for treatment or other patient management decisions. Negative results must be combined with clinical observations, patient history, and epidemiological information. The expected result is Negative. Fact Sheet for Patients: SugarRoll.be Fact Sheet for Healthcare Providers: https://www.woods-mathews.com/ This test is not yet approved or cleared by the [place] FDA and  has been authorized for detection and/or diagnosis of SARS-CoV-2 by FDA under an Emergency Use Authorization (EUA). This EUA will remain  in effect (meaning this test can be used) for the duration of the COVID-19 declaration under Section 56 4(b)(1) of the Act, 21 U.S.C. section 360bbb-3(b)(1),  unless the authorization is terminated or revoked sooner. Performed at [facility], [place] [address] [place], [place] [address]     Procedures and diagnostic studies:  No results found.  Medications:   . atorvastatin  80 mg Oral q1800  . calcium-vitamin D  1 tablet Oral Daily  . ferrous sulfate  325 mg Oral BID WC  . folic acid  [id number] mcg Oral Daily  . insulin aspart  0-15 Units Subcutaneous TID WC  . insulin aspart  0-5 Units Subcutaneous QHS  . insulin glargine  60 Units Subcutaneous QHS  . magnesium oxide  400 mg Oral Daily  . [START ON 05/14/2019] pantoprazole  40 mg Intravenous Q12H  . pyridOXINE  100 mg Oral Daily  . sodium chloride flush  3 mL Intravenous Q12H  . vitamin B-12  1,000 mcg Oral QODAY   Continuous Infusions: . sodium chloride    . pantoprozole (PROTONIX) infusion 8 mg/hr (05/11/19 0159)     LOS: 0 days   Katrinka Herbison  [facility]     05/11/2019, 1:37 PM

## 2019-05-11 NOTE — Plan of Care (Signed)

## 2019-05-11 NOTE — Consult Note (Signed)
Jonathon Bellows , MD [address], [place], [place], [place], [address] [address] [place], [place], [place], [address] Phone: [phone]  Fax: [phone]  Consultation  Referring Provider:    Dr Mal Misty Primary Care Physician:  Perrin Maltese, MD Primary Gastroenterologist:  Dr. [name]         Reason for Consultation:     Anemia   Date of Admission:  05/10/2019 Date of Consultation:  05/11/2019         HPI:   Hannah Sullivan is a 69 y.o. female is a patient who follows at [facility] clinic gastroenterology with Dr. [name].  Last office visit was on 12/14/2018 for iron deficiency anemia.  She had undergone an upper endoscopy and colonoscopy in October 2019 which was normal except for gastritis, hiatal hernia and 3 sessile polyps in the colon consistent with tubular adenomas.  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  Not on any NSAIDs.  In October 2020 the patient was offered a small bowel enteroscopy but the patient declined the procedure.  There is a long telephone conversation documented from December 2020 unclear as to what the plan was regards to the small bowel AVMs, seems to have not consented for the procedure.  She had been receiving IV iron with Dr. Tasia Catchings.   The patient presented to the hospital on 05/09/2018, had her hemoglobin checked at her PCPs office was 5.3 g and was sent to the ER.  3 months prior hemoglobin of 9.5 g on admission was 5.5 g.  MCV of 83.2.  No iron studies checked on admission.  No rise in BUN/creatinine ratio.  This morning hemoglobin is 8 g.  Review of the capsule report suggest that the first duodenal image was a 2 minutes and multiple AVMs were seen from the 10-minute mark to the 46-minute mark .  She denies any overt blood loss through nasal bleeds or hemoptysis or hematemesis or melena or rectal bleeding.  She takes iron tablets.  She states that she was supposed to hear back from her gastroenterologist previously but did not do so.   I also checked with her as to why she did not contact them back but was not clear.  Denies any NSAID use.  Past Medical History:  Diagnosis Date  . Anemia   . Arthritis   . Carotid disease, bilateral ([place])   . Diabetes mellitus without complication ([place])   . Diabetic neuropathy ([place])   . Diabetic retinopathy ([place])   . Dyspnea   . Hyperlipidemia   . Hypertension   . Hypothyroidism   . Microalbuminuria   . Onychomycosis   . Personal history of tobacco use, presenting hazards to health 10/10/2014  . Protein in urine   . Pyloric ulcer associated with Helicobacter pylori   . Spinal stenosis   . Thyroid disease 1980's   treated with radioactive iodine  . Tremors of nervous system     Past Surgical History:  Procedure Laterality Date  . ABDOMINAL HYSTERECTOMY  1196   Dr [facility], [place], [place] Dr Shana Chute  . CESAREAN SECTION  1971/1978   2 c-sections, Dr [facility], [place], [place]  . COLONOSCOPY WITH PROPOFOL N/A 01/05/2018   Procedure: COLONOSCOPY WITH PROPOFOL;  Surgeon: Toledo, Benay Pike, MD;  Location: [facility] ENDOSCOPY;  Service: Gastroenterology;  Laterality: N/A;  . DOPPLER ECHOCARDIOGRAPHY  02/2012   Left Corptid Artery-neck, Dr Chryl Heck [facility]  . endoscope  2009   Dr Maceo Pro, [facility]  Hospital  . ESOPHAGOGASTRODUODENOSCOPY (EGD) WITH PROPOFOL N/A 01/05/2018   Procedure: ESOPHAGOGASTRODUODENOSCOPY (EGD) WITH PROPOFOL;  Surgeon: Toledo, Benay Pike, MD;  Location: [facility] ENDOSCOPY;  Service: Gastroenterology;  Laterality: N/A;  . SHOULDER ARTHROSCOPY WITH ROTATOR CUFF REPAIR Right 10/20/2016   Procedure: SHOULDER ARTHROSCOPY WITH OPEN ROTATOR CUFF REPAIR;  Surgeon: Thornton Park, MD;  Location: [facility] ORS;  Service: Orthopedics;  Laterality: Right;  . TONSILECTOMY/ADENOIDECTOMY WITH MYRINGOTOMY  1961  . TONSILLECTOMY    . [place]    Prior to Admission medications   Medication Sig Start Date End Date Taking?  Authorizing Provider  amLODipine (NORVASC) 5 MG tablet Take 5 mg by mouth daily. 05/09/19  Yes [provider]  aspirin EC 81 MG tablet Take 81 mg by mouth daily.   Yes [provider]  atorvastatin (LIPITOR) 80 MG tablet Take 80 mg by mouth daily at 6 PM.    Yes [provider]  Calcium Carbonate-Vitamin D 600-400 MG-UNIT tablet Take by mouth. Take 1 tablet by mouth 2 (two) times daily with meals   Yes [provider]  calcium-vitamin D (OSCAL WITH D) 500-200 MG-UNIT tablet Take 1 tablet by mouth daily.   Yes [provider]  Cholecalciferol (VITAMIN D3) 50000 units CAPS Take 1 capsule by mouth once a week. 10/16/17  Yes [provider]  Coenzyme Q10 (CO Q-10) 100 MG CAPS Take 100 mg by mouth daily.    Yes [provider]  diltiazem (CARDIZEM CD) 300 MG 24 hr capsule Take 300 mg by mouth daily.  09/16/18  Yes [provider]  ferrous sulfate 325 (65 FE) MG tablet Take 325 mg by mouth 2 (two) times daily with a meal.    Yes [provider]  folic acid (FOLVITE) A999333 MCG tablet Take 400 mcg by mouth daily.   Yes [provider]  hydrochlorothiazide (HYDRODIURIL) 25 MG tablet Take 25 mg by mouth every morning.    Yes [provider]  Insulin Glargine (BASAGLAR KWIKPEN ) Inject 0-100 Units into the skin daily.    Yes [provider]  Insulin Glargine (LANTUS SOLOSTAR) 100 UNIT/ML Solostar Pen 60 Units at bedtime.  07/02/17  Yes [provider]  insulin lispro (HUMALOG) 100 UNIT/ML injection Inject 25 Units into the skin 3 (three) times daily before meals.   Yes [provider]  losartan (COZAAR) 100 MG tablet Take 100 mg by mouth daily. In am.   Yes [provider]  Magnesium 250 MG TABS Take 250 mg by mouth daily.   Yes [provider]  metFORMIN (GLUCOPHAGE) 1000 MG tablet Take 1,000 mg by mouth 2 (two) times daily after a meal.    Yes [provider]    pyridOXINE (VITAMIN B-6) 100 MG tablet Take 100 mg by mouth daily.   Yes [provider]  vitamin B-12 (CYANOCOBALAMIN) 1000 MCG tablet Take 1,000 mcg by mouth every other day.   Yes [provider]  Vitamin D, Ergocalciferol, (DRISDOL) 1.25 MG (50000 UNIT) CAPS capsule Take 50,000 Units by mouth every 7 (seven) days.   Yes [provider]    Family History  Problem Relation Age of Onset  . Osteoporosis Mother   . Heart attack Sister   . Diabetes Sister   . Diabetes Father   . Heart attack Sister   . Stroke Sister   . Deep vein thrombosis Sister   . Breast cancer Neg Hx  Social History   Tobacco Use  . Smoking status: Former Smoker    Packs/day: 0.50    Years: 45.00    Pack years: 22.50    Types: Cigarettes    Quit date: 06/09/2018    Years since quitting: 0.9  . Smokeless tobacco: Never Used  Substance Use Topics  . Alcohol use: No    Comment: occasionally  . Drug use: No    Allergies as of 05/10/2019  . (No Known Allergies)    Review of Systems:    All systems reviewed and negative except where noted in HPI.   Physical Exam:  Vital signs in last 24 hours: Temp:  [98.2 F (36.8 C)-99 F (37.2 C)] 98.3 F (36.8 C) (02/18 0746) Pulse Rate:  [64-97] 64 (02/18 0746) Resp:  [16-25] 17 (02/18 0746) BP: (82-179)/(44-78) 95/59 (02/18 0746) SpO2:  [96 %-100 %] 100 % (02/18 0746) Weight:  [71.2 kg] 71.2 kg (02/17 1254) Last BM Date: 05/09/19 General:   Pleasant, cooperative in NAD Head:  Normocephalic and atraumatic. Eyes:   No icterus.   Conjunctiva pink. PERRLA. Ears:  Normal auditory acuity. Abdomen:  Soft, nondistended, nontender. Normal bowel sounds. No appreciable masses or hepatomegaly.  No rebound or guarding.  Neurologic:  Alert and oriented x3;  grossly normal neurologically. Psych:  Alert and cooperative. Normal affect.  LAB RESULTS: Recent Labs    05/10/19 1306 05/10/19 1306 05/10/19 1653 05/11/19 0031  05/11/19 0509  WBC 8.9  --   --   --   --   HGB 5.5*   < > 5.3* 8.1* 8.0*  HCT 20.3*   < > 19.3* 27.0* 26.1*  PLT 428*  --   --   --   --    < > = values in this interval not displayed.   BMET Recent Labs    05/10/19 1306 05/11/19 0509  NA 137 138  K 4.5 4.2  CL 106 109  CO2 22 22  GLUCOSE 338* 110*  BUN 22 20  CREATININE 1.19* 0.91  CALCIUM 9.0 8.4*   LFT No results for input(s): PROT, ALBUMIN, AST, ALT, ALKPHOS, BILITOT, BILIDIR, IBILI in the last 72 hours. PT/INR No results for input(s): LABPROT, INR in the last 72 hours.  STUDIES: No results found.    Impression / Plan:   Hannah Sullivan is a 69 y.o. y/o female with with a history of iron deficiency anemia who follows with Dr. [name] as an outpatient.  History of an EGD, colonoscopy and capsule study of the small bowel in 2019.  Subsequently was lost to follow-up.  She was found to have AVMs in the small bowel and was advised at one point to undergo more bowel enteroscopy.  Appears that she did not follow-up after that.  Presented to the hospital after incidentally found to have a low hemoglobin.  No iron studies done on admission.  Unsure if this is worsening of the iron deficiency but very likely so. The AVMs were found in the proximal and probably the mid part of the small bowel.  Definitely would need balloon enteroscopy to access them.  Plan 1.  Check iron studies B12 and folate on initial blood sample prior to transfusion.  If low please replace. 2.  I discussed with her that I could perform a push enteroscopy to try and ablate the proximal small bowel AVMs if I did reach them.  Even if I did she would still require a balloon enteroscopy to be performed asn an  out patient at [facility] since she had multiple small bowel AVMs on her capsule study.  She was not able to decide on the options provided.  I informed her that she should let me know if she would like to proceed with a push enteroscopy.   Thank you for involving me  in the care of this patient.      LOS: 0 days   Jonathon Bellows, MD  05/11/2019, 8:49 AM

## 2019-05-11 NOTE — Progress Notes (Addendum)
Patient has home medications at bedside and refuses to have medications taken to pharmacy. She states she will take her home medications while here at the hospital.  Her medications at bedside include:  Vitamin D3- 50,000 units once weekly - Friday  Folic Acid A999333 mcg - daily  Magnesium 250 mg - daily  CoQ 10 100 mg - daily  Calcium Citrate 500 mg + 10 Mcg D3 - daily  B12 1000 mcg - daily  Vitamin B6 100 mg - daily  Ferrous Sulfate 325 mg  - daily  Metformin 1,000 mg - BID  Asprin 81 mg - daily  Diltiazem 24ER - 300 mg - daily  Atorvastatin Calcium 80 mg - daily  Losartan Potassium 100 mg - daily  Hydrochlorothiazide 25 mg - daily  Patient has insulin which she IS NOT using while here.  Hospital staff are providing all insulins.

## 2019-05-12 LAB — CBC WITH DIFFERENTIAL/PLATELET
Abs Immature Granulocytes: 0.03 10*3/uL (ref 0.00–0.07)
Basophils Absolute: 0 10*3/uL (ref 0.0–0.1)
Basophils Relative: 1 %
Eosinophils Absolute: 0.1 10*3/uL (ref 0.0–0.5)
Eosinophils Relative: 2 %
HCT: 27.9 % — ABNORMAL LOW (ref 36.0–46.0)
Hemoglobin: 8.2 g/dL — ABNORMAL LOW (ref 12.0–15.0)
Immature Granulocytes: 0 %
Lymphocytes Relative: 19 %
Lymphs Abs: 1.5 10*3/uL (ref 0.7–4.0)
MCH: 25.2 pg — ABNORMAL LOW (ref 26.0–34.0)
MCHC: 29.4 g/dL — ABNORMAL LOW (ref 30.0–36.0)
MCV: 85.6 fL (ref 80.0–100.0)
Monocytes Absolute: 0.8 10*3/uL (ref 0.1–1.0)
Monocytes Relative: 11 %
Neutro Abs: 5.4 10*3/uL (ref 1.7–7.7)
Neutrophils Relative %: 67 %
Platelets: 317 10*3/uL (ref 150–400)
RBC: 3.26 MIL/uL — ABNORMAL LOW (ref 3.87–5.11)
RDW: 18.6 % — ABNORMAL HIGH (ref 11.5–15.5)
WBC: 7.9 10*3/uL (ref 4.0–10.5)
nRBC: 0 % (ref 0.0–0.2)

## 2019-05-12 LAB — GLUCOSE, CAPILLARY
Glucose-Capillary: 121 mg/dL — ABNORMAL HIGH (ref 70–99)
Glucose-Capillary: 190 mg/dL — ABNORMAL HIGH (ref 70–99)

## 2019-05-12 NOTE — Progress Notes (Signed)
Written and verbal discharge instructions discussed with pt. Discussed medication, follow-up appointments and when to call MD or return to ER. Pt verbalized understanding. IV and tele dc'd. Belongings with pt. Pt to car via wheelchair by volunteer.

## 2019-05-12 NOTE — Plan of Care (Signed)
  Problem: Education: Goal: Knowledge of General Education information will improve Description Including pain rating scale, medication(s)/side effects and non-pharmacologic comfort measures Outcome: Progressing   

## 2019-05-12 NOTE — Discharge Summary (Signed)
Physician Discharge Summary  Hannah Sullivan [id number] DOB: [birth year] DOA: 05/10/2019  PCP: Perrin Maltese, MD  Admit date: 05/10/2019 Discharge date: 05/13/2019  Discharge disposition: Home   Recommendations for Outpatient Follow-Up:    Follow-up with PCP and gastroenterologist as scheduled   Discharge Diagnosis:   Active Problems:   Iron deficiency anemia   Type 2 diabetes mellitus ([place])   2-vessel coronary artery disease   AVM (arteriovenous malformation) of small bowel, acquired   HTN (hypertension), benign   Hyperlipidemia associated with type 2 diabetes mellitus ([facility])   Tobacco dependence   Symptomatic anemia   Severe anemia    Discharge Condition: Stable.  Diet recommendation: Low-salt, low sugar diet  Code status: Full code.    Hospital Course:   Hannah Sullivan is an 69 y.o. female with medical history significant for diabetes mellitus with peripheral neuropathy and retinopathy, carotid artery disease, hypothyroidism, CAD, iron deficiency anemia, vitamin B12 deficiency anemia, GI bleed, small bowel AVMs (EGD, colonoscopy and capsule endoscopy in November 2019 showed polyps and small bowel AVMs).  She was supposed to follow-up with [facility] gastroenterology for balloon enteroscopy but she has not been able to follow-up.  She used to get IV iron infusion for iron deficiency anemia.  She said she had not received any for about 2 months now.  She presented to the hospital for increasing generalized weakness, shortness of breath and easy fatigability.  She went to her PCP the day prior to admission and her hemoglobin was 5.3.  She was subsequently referred to the emergency room for further evaluation.  In the ED, her hemoglobin was 5.5 he was admitted to the hospital for acute on chronic blood loss anemia probably secondary to occult GI bleeding.  She did not provide a stool specimen for occult blood test.  She was transfused with 2 units of packed red blood cells and  hemoglobin improved.  Posttransfusion hemoglobin was stable.  Iron studies consistent with iron deficiency anemia.  She was evaluated by the gastroenterologist, Dr. Vicente Males.  She was offered push enteroscopy but patient declined.  She said she preferred to follow-up as an outpatient for a more complete endoscopic work-up.  Patient takes low-dose aspirin for CAD and carotid artery disease.  Risks and benefits of aspirin were discussed especially in the setting of possible GI bleeding and anemia.  Patient was upset that I was asking her to make a decision.  She said "why did your parents sacrifice to take you to school for".  Eventually, patient said that she had been taking low-dose aspirin for 20 years without any problems and she prefers to continue taking aspirin.  She was informed that she can hold off on aspirin if she notices overt GI bleeding, and inform her physician about it.   We tried to schedule an appointment for her with [facility] gastroenterology.  However, I was informed by the secretary that [facility] gastroenterology could not make an appointment for her and instead suggested that patient see a physician at [facility] clinic since she been there in the past.  An appointment was made for her to see Dr. Sherilyn Cooter, with family medicine on June 13, 2019.  She was also advised to make an appointment to see Dr. [name], gastroenterologist, as an outpatient.  She verbalized understanding of the discharge plan.  She is deemed stable for discharge to home.      Discharge Exam:   Vitals:   05/12/19 0819 05/12/19 1136  BP: (!) 96/56 (!) 161/57  Pulse: 75 71  Resp: 17 18  Temp: 98.5 F (36.9 C) 97.9 F (36.6 C)  SpO2: 98% 100%   Vitals:   05/11/19 1940 05/12/19 0523 05/12/19 0819 05/12/19 1136  BP: 107/60 (!) 118/57 (!) 96/56 (!) 161/57  Pulse: 76 68 75 71  Resp: 18 20 17 18   Temp: 98.7 F (37.1 C) 98.2 F (36.8 C) 98.5 F (36.9 C) 97.9 F (36.6 C)  TempSrc: Oral Oral Oral Oral  SpO2: 99% 100%  98% 100%  Weight:      Height:         GEN: NAD SKIN: No rash EYES: EOMI ENT: MMM CV: RRR PULM: CTA B ABD: soft, ND, NT, +BS CNS: AAO x 3, non focal EXT: No edema or tenderness   The results of significant diagnostics from this hospitalization (including imaging, microbiology, ancillary and laboratory) are listed below for reference.     Procedures and Diagnostic Studies:   No results found.   Labs:   Basic Metabolic Panel: Recent Labs  Lab 05/10/19 1306 05/11/19 0509  NA 137 138  K 4.5 4.2  CL 106 109  CO2 22 22  GLUCOSE 338* 110*  BUN 22 20  CREATININE 1.19* 0.91  CALCIUM 9.0 8.4*   GFR Estimated Creatinine Clearance: 51.9 mL/min (by C-G formula based on SCr of 0.91 mg/dL). Liver Function Tests: No results for input(s): AST, ALT, ALKPHOS, BILITOT, PROT, ALBUMIN in the last 168 hours. No results for input(s): LIPASE, AMYLASE in the last 168 hours. No results for input(s): AMMONIA in the last 168 hours. Coagulation profile No results for input(s): INR, PROTIME in the last 168 hours.  CBC: Recent Labs  Lab 05/10/19 1306 05/10/19 1653 05/11/19 0031 05/11/19 0509 05/12/19 0850  WBC 8.9  --   --   --  7.9  NEUTROABS 6.5  --   --   --  5.4  HGB 5.5* 5.3* 8.1* 8.0* 8.2*  HCT 20.3* 19.3* 27.0* 26.1* 27.9*  MCV 83.2  --   --   --  85.6  PLT 428*  --   --   --  317   Cardiac Enzymes: No results for input(s): CKTOTAL, CKMB, CKMBINDEX, TROPONINI in the last 168 hours. BNP: Invalid input(s): POCBNP CBG: Recent Labs  Lab 05/11/19 1152 05/11/19 1629 05/11/19 2155 05/12/19 0820 05/12/19 1138  GLUCAP 115* 163* 230* 121* 190*   D-Dimer No results for input(s): DDIMER in the last 72 hours. Hgb A1c Recent Labs    05/10/19 1653  HGBA1C 5.9*   Lipid Profile No results for input(s): CHOL, HDL, LDLCALC, TRIG, CHOLHDL, LDLDIRECT in the last 72 hours. Thyroid function studies No results for input(s): TSH, T4TOTAL, T3FREE, THYROIDAB in the last 72  hours.  Invalid input(s): FREET3 Anemia work up Recent Labs    05/11/19 0509  VITAMINB12 633  FOLATE 17.5  FERRITIN 9*  TIBC 414  IRON 11*   Microbiology Recent Results (from the past 240 hour(s))  SARS CORONAVIRUS 2 (TAT 6-24 HRS) Nasopharyngeal Nasopharyngeal Swab     Status: None   Collection Time: 05/10/19  2:37 PM   Specimen: Nasopharyngeal Swab  Result Value Ref Range Status   SARS Coronavirus 2 NEGATIVE NEGATIVE Final    Comment: (NOTE) SARS-CoV-2 target nucleic acids are NOT DETECTED. The SARS-CoV-2 RNA is generally detectable in upper and lower respiratory specimens during the acute phase of infection. Negative results do not preclude SARS-CoV-2 infection, do not rule out co-infections with other pathogens, and should not be  used as the sole basis for treatment or other patient management decisions. Negative results must be combined with clinical observations, patient history, and epidemiological information. The expected result is Negative. Fact Sheet for Patients: SugarRoll.be Fact Sheet for Healthcare Providers: https://www.woods-mathews.com/ This test is not yet approved or cleared by the [place] FDA and  has been authorized for detection and/or diagnosis of SARS-CoV-2 by FDA under an Emergency Use Authorization (EUA). This EUA will remain  in effect (meaning this test can be used) for the duration of the COVID-19 declaration under Section 56 4(b)(1) of the Act, 21 U.S.C. section 360bbb-3(b)(1), unless the authorization is terminated or revoked sooner. Performed at [facility], [place] [address], [place], [place] [address]      Discharge Instructions:   Discharge Instructions    Diet - low sodium heart healthy   Complete by: As directed    Diet Carb Modified   Complete by: As directed      Allergies as of 05/12/2019   No Known Allergies     Medication List    STOP taking these medications     Vitamin D3 1.25 MG (50000 UT) Caps     TAKE these medications   amLODipine 5 MG tablet Commonly known as: NORVASC Take 5 mg by mouth daily.   aspirin EC 81 MG tablet Take 81 mg by mouth daily.   atorvastatin 80 MG tablet Commonly known as: LIPITOR Take 80 mg by mouth daily at 6 PM.   Calcium Carbonate-Vitamin D 600-400 MG-UNIT tablet Take by mouth. Take 1 tablet by mouth 2 (two) times daily with meals   calcium-vitamin D 500-200 MG-UNIT tablet Commonly known as: OSCAL WITH D Take 1 tablet by mouth daily.   Co Q-10 100 MG Caps Take 100 mg by mouth daily.   diltiazem 300 MG 24 hr capsule Commonly known as: CARDIZEM CD Take 300 mg by mouth daily.   ferrous sulfate 325 (65 FE) MG tablet Take 325 mg by mouth 2 (two) times daily with a meal.   folic acid A999333 MCG tablet Commonly known as: FOLVITE Take 400 mcg by mouth daily.   hydrochlorothiazide 25 MG tablet Commonly known as: HYDRODIURIL Take 25 mg by mouth every morning.   insulin lispro 100 UNIT/ML injection Commonly known as: HUMALOG Inject 25 Units into the skin 3 (three) times daily before meals.   BASAGLAR KWIKPEN [place] Inject 0-100 Units into the skin daily.   Lantus SoloStar 100 UNIT/ML Solostar Pen Generic drug: Insulin Glargine 60 Units at bedtime.   losartan 100 MG tablet Commonly known as: COZAAR Take 100 mg by mouth daily. In am.   Magnesium 250 MG Tabs Take 250 mg by mouth daily.   metFORMIN 1000 MG tablet Commonly known as: GLUCOPHAGE Take 1,000 mg by mouth 2 (two) times daily after a meal.   pyridOXINE 100 MG tablet Commonly known as: VITAMIN B-6 Take 100 mg by mouth daily.   vitamin B-12 1000 MCG tablet Commonly known as: CYANOCOBALAMIN Take 1,000 mcg by mouth every other day.   Vitamin D (Ergocalciferol) 1.25 MG (50000 UNIT) Caps capsule Commonly known as: DRISDOL Take 50,000 Units by mouth every 7 (seven) days.      Follow-up Information    Mariana Arn, MD. Go on 06/13/2019.    Specialty: Family Medicine Why: appointment at 3:30pm Contact information: [place] [address] [phone]            Time coordinating discharge: 35 minutes  Signed:  Jennye Boroughs  [facility] 05/13/2019, 4:41 PM

## 2019-05-15 ENCOUNTER — Other Ambulatory Visit: Payer: Self-pay

## 2019-05-15 ENCOUNTER — Inpatient Hospital Stay: Payer: Medicare Other | Attending: Oncology

## 2019-05-15 DIAGNOSIS — Z79899 Other long term (current) drug therapy: Secondary | ICD-10-CM | POA: Insufficient documentation

## 2019-05-15 DIAGNOSIS — D509 Iron deficiency anemia, unspecified: Secondary | ICD-10-CM | POA: Diagnosis present

## 2019-05-15 DIAGNOSIS — Q2739 Arteriovenous malformation, other site: Secondary | ICD-10-CM | POA: Diagnosis not present

## 2019-05-15 LAB — CBC
HCT: 30.8 % — ABNORMAL LOW (ref 36.0–46.0)
Hemoglobin: 8.8 g/dL — ABNORMAL LOW (ref 12.0–15.0)
MCH: 24.9 pg — ABNORMAL LOW (ref 26.0–34.0)
MCHC: 28.6 g/dL — ABNORMAL LOW (ref 30.0–36.0)
MCV: 87.3 fL (ref 80.0–100.0)
Platelets: 333 10*3/uL (ref 150–400)
RBC: 3.53 MIL/uL — ABNORMAL LOW (ref 3.87–5.11)
RDW: 20.2 % — ABNORMAL HIGH (ref 11.5–15.5)
WBC: 10.8 10*3/uL — ABNORMAL HIGH (ref 4.0–10.5)
nRBC: 0 % (ref 0.0–0.2)

## 2019-05-15 LAB — IRON AND TIBC
Iron: 162 ug/dL (ref 28–170)
Saturation Ratios: 37 % — ABNORMAL HIGH (ref 10.4–31.8)
TIBC: 440 ug/dL (ref 250–450)
UIBC: 278 ug/dL

## 2019-05-15 LAB — FERRITIN: Ferritin: 14 ng/mL (ref 11–307)

## 2019-05-16 ENCOUNTER — Ambulatory Visit: Payer: Medicare Other

## 2019-05-16 ENCOUNTER — Ambulatory Visit: Payer: Medicare Other | Admitting: Oncology

## 2019-05-16 ENCOUNTER — Other Ambulatory Visit: Payer: Self-pay

## 2019-05-17 ENCOUNTER — Other Ambulatory Visit: Payer: Self-pay

## 2019-05-17 ENCOUNTER — Encounter: Payer: Self-pay | Admitting: Oncology

## 2019-05-17 ENCOUNTER — Inpatient Hospital Stay (HOSPITAL_BASED_OUTPATIENT_CLINIC_OR_DEPARTMENT_OTHER): Payer: Medicare Other | Admitting: Oncology

## 2019-05-17 ENCOUNTER — Inpatient Hospital Stay: Payer: Medicare Other

## 2019-05-17 VITALS — BP 103/73 | HR 69 | Temp 98.3°F | Resp 18 | Wt 150.9 lb

## 2019-05-17 VITALS — BP 123/67 | HR 68 | Resp 18

## 2019-05-17 DIAGNOSIS — D509 Iron deficiency anemia, unspecified: Secondary | ICD-10-CM

## 2019-05-17 DIAGNOSIS — D631 Anemia in chronic kidney disease: Secondary | ICD-10-CM | POA: Diagnosis not present

## 2019-05-17 DIAGNOSIS — K5520 Angiodysplasia of colon without hemorrhage: Secondary | ICD-10-CM

## 2019-05-17 DIAGNOSIS — N1831 Chronic kidney disease, stage 3a: Secondary | ICD-10-CM

## 2019-05-17 DIAGNOSIS — D500 Iron deficiency anemia secondary to blood loss (chronic): Secondary | ICD-10-CM

## 2019-05-17 MED ORDER — SODIUM CHLORIDE 0.9 % IV SOLN
INTRAVENOUS | Status: DC
  Filled 2019-05-17: qty 250

## 2019-05-17 MED ORDER — IRON SUCROSE 20 MG/ML IV SOLN
200.0000 mg | Freq: Once | INTRAVENOUS | Status: AC
  Administered 2019-05-17: 200 mg via INTRAVENOUS
  Filled 2019-05-17: qty 10

## 2019-05-17 NOTE — Progress Notes (Signed)
Patient here for follow up. Pt went to ER last week due to hgb of 5.3 and had blood transfusion. Pt reports feeling "ok."

## 2019-05-17 NOTE — Progress Notes (Signed)
Hematology/Oncology follow  up note [facility] Telephone:([phone]) [phone] Fax:(336) [phone]   Patient Care Team: Perrin Maltese, MD as PCP - General (Internal Medicine)  REFERRING PROVIDER: Perrin Maltese, MD REASON FOR VISIT:  Follow up for  iron deficiency anemia  HISTORY OF PRESENTING ILLNESS:  Hannah Sullivan is a  69 y.o.  female with PMH listed below who was referred to me for evaluation of iron deficiency anemia Patient follows up with primary care physician Dr. Yancey Flemings recently had labs done on 12/01/2017 at [facility] clinic. Labs reviewed.  CBC showed hemoglobin 7.6, hematocrit 27, MCV 76.5, WBC 10.8, platelet count 425.  Differential is normal CMP showed normal creatinine and bilirubin level. 11/18/2017 iron panel showed TIBC 537, saturation 2, 11/18/2017 ferritin 7. History of iron deficiency: She recalls remotely when she was pregnant she used to take iron supplementation.  Currently she takes oral iron supplementation since she got her blood results. Rectal bleeding: Denies reports that she has dark stool after taking oral iron supplementation. Menstrual bleeding/ Vaginal bleeding : Denies Hematemesis or hemoptysis : denies Blood in urine : denies  Pica: Denies Last endoscopy: She had a  Fatigue: reports worsening fatigue. Chronic onset, perisistent, no aggravating or improving factors, no associated symptoms.  SOB: denies Denies weight loss, fever or chills, abdominal pain, chest pain.  #GI work-up includes colonoscopy, endoscopy  colonoscopy in 2011 in [place] 01/05/2018 Dr.Toledo.  Upper endoscopy showed gastritis, hiatal hernia, no active bleeding source with discomfort.  Colonoscopy showed small polyps in the sigmoid colon, descending colon and transverse colon.  Resected and retrieved.  Nonbleeding internal hemorrhoids.  Otherwise normal. Pathology negative for malignancy.  # Patient has had upper and lower endoscopy done in October  2019. 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   INTERVAL HISTORY Hannah Sullivan is a 69 y.o. female who has above history reviewed by me today presents for follow up visit for management of iron deficiency anemia.  Patient has previously received IV Venofer treatments and tolerated well. She recently presented to the emergency room on 05/10/2019 due to generalized weakness and CBC showed hemoglobin of 5.5.  Patient was admitted for acute on chronic blood loss anemia due to occult GI bleeding/AVM.  Patient received 2 units of PRBC and hemoglobin improved.  Patient was evaluated by Dr. Vicente Males gastroenterology in the hospital.  Patient was offered push enteroscopy but patient declined.  Patient takes aspirin 81 mg for CAD and carotid artery disease.  Hospitalist Dr. Mal Misty discussed the risk and benefit of aspirin in the context of possible GI bleed.  Patient was informed that she can hold off on aspirin if she notice overt GI bleeding and inform physician about that. Patient was discharged with advice of following up with primary care doctor and Dr. [name] who is his gastroenterologist as outpatient.  Today patient presents for her follow-up for iron deficiency anemia.  She reports feeling weak but improved from prior to her recent admission.Patient denies having any black or bloody stool.   She does not talk much or answer questions.  She keeps saying " what ever, it does not matter".    Review of Systems  Constitutional: Positive for malaise/fatigue. Negative for fever and weight loss.  HENT: Negative for sore throat.   Respiratory: Negative for cough.   Cardiovascular: Negative for leg swelling.  Gastrointestinal: Negative for abdominal pain.  Musculoskeletal: Negative for falls.  Skin: Negative for rash.  Neurological: Positive for tremors. Negative for focal weakness.  Psychiatric/Behavioral: Negative for hallucinations.     MEDICAL HISTORY:  Past Medical History:  Diagnosis Date  . Anemia   . Arthritis   . Carotid disease, bilateral ([place])   . Diabetes mellitus without complication ([place])   . Diabetic neuropathy ([place])   . Diabetic retinopathy ([place])   . Dyspnea   . Hyperlipidemia   . Hypertension   . Hypothyroidism   . Microalbuminuria   . Onychomycosis   . Personal history of tobacco use, presenting hazards to health 10/10/2014  . Protein in urine   . Pyloric ulcer associated with Helicobacter pylori   . Spinal stenosis   . Thyroid disease 1980's   treated with radioactive iodine  . Tremors of nervous system     SURGICAL HISTORY: Past Surgical History:  Procedure Laterality Date  . ABDOMINAL HYSTERECTOMY  1196   Dr [facility], [place], [place] Dr Shana Chute  . CESAREAN SECTION  1971/1978   2 c-sections, Dr [facility], [place], [place]  . COLONOSCOPY WITH PROPOFOL N/A 01/05/2018   Procedure: COLONOSCOPY WITH PROPOFOL;  Surgeon: Toledo, Benay Pike, MD;  Location: [facility] ENDOSCOPY;  Service: Gastroenterology;  Laterality: N/A;  . DOPPLER ECHOCARDIOGRAPHY  02/2012   Left Corptid Artery-neck, Dr Chryl Heck [facility]  . endoscope  2009   Dr Maceo Pro, [facility]  . ESOPHAGOGASTRODUODENOSCOPY (EGD) WITH PROPOFOL N/A 01/05/2018   Procedure: ESOPHAGOGASTRODUODENOSCOPY (EGD) WITH PROPOFOL;  Surgeon: Toledo, Benay Pike, MD;  Location: [facility] ENDOSCOPY;  Service: Gastroenterology;  Laterality: N/A;  . SHOULDER ARTHROSCOPY WITH ROTATOR CUFF REPAIR Right 10/20/2016   Procedure: SHOULDER ARTHROSCOPY WITH OPEN ROTATOR CUFF REPAIR;  Surgeon: Thornton Park, MD;  Location: [facility] ORS;  Service: Orthopedics;  Laterality: Right;  . TONSILECTOMY/ADENOIDECTOMY WITH MYRINGOTOMY  1961  . TONSILLECTOMY    . [place]    SOCIAL HISTORY: Social History   Socioeconomic History  . Marital status: Widowed    Spouse name: Not on file  . Number of children: Not  on file  . Years of education: Not on file  . Highest education level: Not on file  Occupational History  . Not on file  Tobacco Use  . Smoking status: Former Smoker    Packs/day: 0.50    Years: 45.00    Pack years: 22.50    Types: Cigarettes    Quit date: 06/09/2018    Years since quitting: 0.9  . Smokeless tobacco: Never Used  Substance and Sexual Activity  . Alcohol use: No    Comment: occasionally  . Drug use: No  . Sexual activity: Not on file  Other Topics Concern  . Not on file  Social History Narrative   Pt lives alone   Social Determinants of Health   Financial Resource Strain:   . Difficulty of Paying Living Expenses: Not on file  Food Insecurity:   . Worried About Charity fundraiser in the Last Year: Not on file  . Ran Out of Food in the Last Year: Not on file  Transportation Needs:   . Lack of Transportation (Medical): Not on file  . Lack of Transportation (Non-Medical): Not on file  Physical Activity:   . Days of Exercise per Week: Not on file  . Minutes of Exercise per Session: Not on file  Stress:   . Feeling of Stress : Not on file  Social Connections:   . Frequency of Communication with Friends and  Family: Not on file  . Frequency of Social Gatherings with Friends and Family: Not on file  . Attends Religious Services: Not on file  . Active Member of Clubs or Organizations: Not on file  . Attends Archivist Meetings: Not on file  . Marital Status: Not on file  Intimate Partner Violence:   . Fear of Current or Ex-Partner: Not on file  . Emotionally Abused: Not on file  . Physically Abused: Not on file  . Sexually Abused: Not on file    FAMILY HISTORY: Family History  Problem Relation Age of Onset  . Osteoporosis Mother   . Heart attack Sister   . Diabetes Sister   . Diabetes Father   . Heart attack Sister   . Stroke Sister   . Deep vein thrombosis Sister   . Breast cancer Neg Hx     ALLERGIES:  has No Known  Allergies.  MEDICATIONS:  Current Outpatient Medications  Medication Sig Dispense Refill  . aspirin EC 81 MG tablet Take 81 mg by mouth daily.    Marland Kitchen atorvastatin (LIPITOR) 80 MG tablet Take 80 mg by mouth daily at 6 PM.     . Calcium Carbonate-Vitamin D 600-400 MG-UNIT tablet Take by mouth. Take 1 tablet by mouth 2 (two) times daily with meals    . Coenzyme Q10 (CO Q-10) 100 MG CAPS Take 100 mg by mouth daily.     Marland Kitchen diltiazem (CARDIZEM CD) 300 MG 24 hr capsule Take 300 mg by mouth daily.     . ferrous sulfate 325 (65 FE) MG tablet Take 325 mg by mouth 2 (two) times daily with a meal.     . folic acid (FOLVITE) A999333 MCG tablet Take 400 mcg by mouth daily.    . hydrochlorothiazide (HYDRODIURIL) 25 MG tablet Take 25 mg by mouth every morning.     . Insulin Glargine (BASAGLAR KWIKPEN Christine) Inject 60 Units into the skin daily.     . insulin lispro (HUMALOG) 100 UNIT/ML injection Inject 35 Units into the skin 3 (three) times daily before meals.     Marland Kitchen losartan (COZAAR) 100 MG tablet Take 100 mg by mouth daily. In am.    . Magnesium 250 MG TABS Take 250 mg by mouth daily.    . metFORMIN (GLUCOPHAGE) 1000 MG tablet Take 1,000 mg by mouth 2 (two) times daily after a meal.     . pyridOXINE (VITAMIN B-6) 100 MG tablet Take 100 mg by mouth daily.    . vitamin B-12 (CYANOCOBALAMIN) 1000 MCG tablet Take 1,000 mcg by mouth every other day.    . Vitamin D, Ergocalciferol, (DRISDOL) 1.25 MG (50000 UNIT) CAPS capsule Take 50,000 Units by mouth every 7 (seven) days.    Marland Kitchen amLODipine (NORVASC) 5 MG tablet Take 5 mg by mouth daily.    . calcium-vitamin D (OSCAL WITH D) 500-200 MG-UNIT tablet Take 1 tablet by mouth daily.    . Insulin Glargine (LANTUS SOLOSTAR) 100 UNIT/ML Solostar Pen 60 Units at bedtime.      No current facility-administered medications for this visit.   Facility-Administered Medications Ordered in Other Visits  Medication Dose Route Frequency Provider Last Rate Last Admin  . 0.9 %  sodium  chloride infusion   Intravenous Continuous Earlie Server, MD   Stopped at 05/17/19 1451     PHYSICAL EXAMINATION: ECOG PERFORMANCE STATUS: 1 - Symptomatic but completely ambulatory Vitals:   05/17/19 1315  BP: 103/73  Pulse: 69  Resp: 18  Temp: 98.3 F (36.8 C)   Filed Weights   05/17/19 1315  Weight: 150 lb 14.4 oz (68.4 kg)    Physical Exam HENT:     Head: Normocephalic and atraumatic.  Eyes:     General: No scleral icterus. Cardiovascular:     Rate and Rhythm: Normal rate.     Heart sounds: Normal heart sounds.  Pulmonary:     Effort: Pulmonary effort is normal.  Abdominal:     General: Bowel sounds are normal.     Palpations: Abdomen is soft.  Musculoskeletal:        General: No deformity. Normal range of motion.     Cervical back: Normal range of motion and neck supple.  Skin:    General: Skin is warm and dry.     Findings: No erythema or rash.  Neurological:     Mental Status: She is alert and oriented to person, place, and time. Mental status is at baseline.  Psychiatric:     Comments: Flat affect      LABORATORY DATA:  I have reviewed the data as listed Lab Results  Component Value Date   WBC 10.8 (H) 05/15/2019   HGB 8.8 (L) 05/15/2019   HCT 30.8 (L) 05/15/2019   MCV 87.3 05/15/2019   PLT 333 05/15/2019   Recent Labs    08/12/18 1337 05/10/19 1306 05/11/19 0509  NA 140 137 138  K 3.8 4.5 4.2  CL 106 106 109  CO2 25 22 22   GLUCOSE 119* 338* 110*  BUN 29* 22 20  CREATININE 1.11* 1.19* 0.91  CALCIUM 9.7 9.0 8.4*  GFRNONAA 51* 46* >60  GFRAA 59* 54* >60  PROT 6.8  --   --   ALBUMIN 3.8  --   --   AST 18  --   --   ALT 16  --   --   ALKPHOS 47  --   --   BILITOT 0.4  --   --    Iron/TIBC/Ferritin/ %Sat    Component Value Date/Time   IRON 162 05/15/2019 1327   TIBC 440 05/15/2019 1327   FERRITIN 14 05/15/2019 1327   IRONPCTSAT 37 (H) 05/15/2019 1327        ASSESSMENT & PLAN:  1. Iron deficiency anemia due to chronic blood loss    2. AVM (arteriovenous malformation) of small bowel, acquired   3. Anemia due to stage 3a chronic kidney disease    #Iron deficiency anemia secondary to chronic blood loss. Labs were reviewed and discussed with patient. Her hemoglobin has improved to 8.8 since discharge. Iron panel was done during her admission which is consistent with severe iron deficiency. It was repeated on 05/15/2019 as a pre-office visit lab.[I was not notified by her or the hospital about her admission and I was not aware that iron tests were been done during her admission].  I discussed with patient that her ferritin has improved to 14, iron saturation 37 however I do not think the iron saturation truly reflects her iron store given the recent blood transfusions. In the context of borderline low ferritin level, chronic blood loss due to AVM, chronic kidney disease, I recommend patient to proceed with IV Venofer 200 mg for 2-3 treatments.  Patient asks why should she get IV iron treatments with an improved ferritin level and a high iron saturation level.  I spent time explaining to her why I recommend IV Venofer treatments and after that she said " what ever, it does  not matter".  She finally agrees with 1 IV Venofer treatments. I recommend patient to follow-up in the clinic in 6 weeks with repeat blood work and possible IV iron treatments. Patient initially declined making follow-up appointments.  After explanation, she agrees with to make an appointment in 6 weeks.  #Small bowel AVMs, patient follows up with [facility] gastroenterology clinic. Reviewed recent telephone notes from GI clinic.  Patient has been referred to [facility] gastroenterology for evaluation of small bowel enteroscopy.  #Patient appears to have depressed mood.  She refuses to answer depression screening questions.  She does not have any established diagnosis of mood disorders. I forwarded my note to patient's primary care provider Dr. Humphrey Rolls for  follow-up.  Return to clinic for MD assessment with lab CBC, iron, TIBC, ferritin in 6 weeks.   Orders Placed This Encounter  Procedures  . CBC with Differential/Platelet    Standing Status:   Future    Standing Expiration Date:   05/16/2020  . Ferritin    Standing Status:   Future    Standing Expiration Date:   05/16/2020  . Iron and TIBC    Standing Status:   Future    Standing Expiration Date:   05/16/2020     . Earlie Server, MD, PhD Hematology Oncology [facility] Pager- [phone] 05/17/2019

## 2019-05-26 ENCOUNTER — Ambulatory Visit: Payer: Medicare Other | Attending: Internal Medicine

## 2019-05-26 DIAGNOSIS — Z23 Encounter for immunization: Secondary | ICD-10-CM | POA: Insufficient documentation

## 2019-05-26 NOTE — Progress Notes (Signed)
   Covid-19 Vaccination Clinic  Name:  Hannah Sullivan    MRN: [id number] DOB: May 08, 1950  05/26/2019  [name] was observed post Covid-19 immunization for 15 minutes without incident. She was provided with Vaccine Information Sheet and instruction to access the V-Safe system.   [name] was instructed to call 911 with any severe reactions post vaccine: Marland Kitchen Difficulty breathing  . Swelling of face and throat  . A fast heartbeat  . A bad rash all over body  . Dizziness and weakness   Immunizations Administered    Name Date Dose VIS Date Route   Pfizer COVID-19 Vaccine 05/26/2019  9:07 AM 0.3 mL 03/03/2019 Intramuscular   Manufacturer: [place]   Lot: [id number]   [place]: [id number]

## 2019-06-12 ENCOUNTER — Emergency Department
Admission: EM | Admit: 2019-06-12 | Discharge: 2019-06-12 | Discharge status: Against Medical Advice | Payer: Medicare Other

## 2019-06-12 NOTE — ED Notes (Signed)
This RN called patient over for triage; pt states, "I really dont think its necessary that I be here, my numbers have been this low before and I have a follow up appointment scheduled for tomorrow."

## 2019-06-12 NOTE — ED Notes (Signed)
First Nurse Note:  Pt brought over from [place] In due to abnormal lab, Hgb 7.5.  Pt alert, NAD noted upon arrival.

## 2019-06-16 ENCOUNTER — Ambulatory Visit: Payer: Medicare Other | Attending: Internal Medicine

## 2019-06-16 DIAGNOSIS — Z23 Encounter for immunization: Secondary | ICD-10-CM

## 2019-06-16 NOTE — Progress Notes (Signed)
   Covid-19 Vaccination Clinic  Name:  Kaidee Schieferstein    MRN: [id number] DOB: [birth year]  06/16/2019  Ms. Joeckel was observed post Covid-19 immunization for 15 minutes without incident. She was provided with Vaccine Information Sheet and instruction to access the V-Safe system.   Ms. Friedrich was instructed to call 911 with any severe reactions post vaccine: Marland Kitchen Difficulty breathing  . Swelling of face and throat  . A fast heartbeat  . A bad rash all over body  . Dizziness and weakness   Immunizations Administered    Name Date Dose VIS Date Route   Pfizer COVID-19 Vaccine 06/16/2019 10:10 AM 0.3 mL 03/03/2019 Intramuscular   Manufacturer: [employer]   Lot: [id number]   [place]: [id number]

## 2019-06-26 ENCOUNTER — Inpatient Hospital Stay: Payer: Medicare Other

## 2019-06-28 ENCOUNTER — Ambulatory Visit: Payer: Medicare Other

## 2019-06-28 ENCOUNTER — Ambulatory Visit: Payer: Medicare Other | Admitting: Oncology

## 2020-06-07 IMAGING — MG DIGITAL SCREENING BILATERAL MAMMOGRAM WITH TOMO AND CAD
8 series · 8 of 24 positions shown · non-contrast
Comparison: Previous exam(s).

CLINICAL DATA: Screening.

EXAM:
DIGITAL SCREENING BILATERAL MAMMOGRAM WITH TOMO AND CAD

[L CC synth-2D]
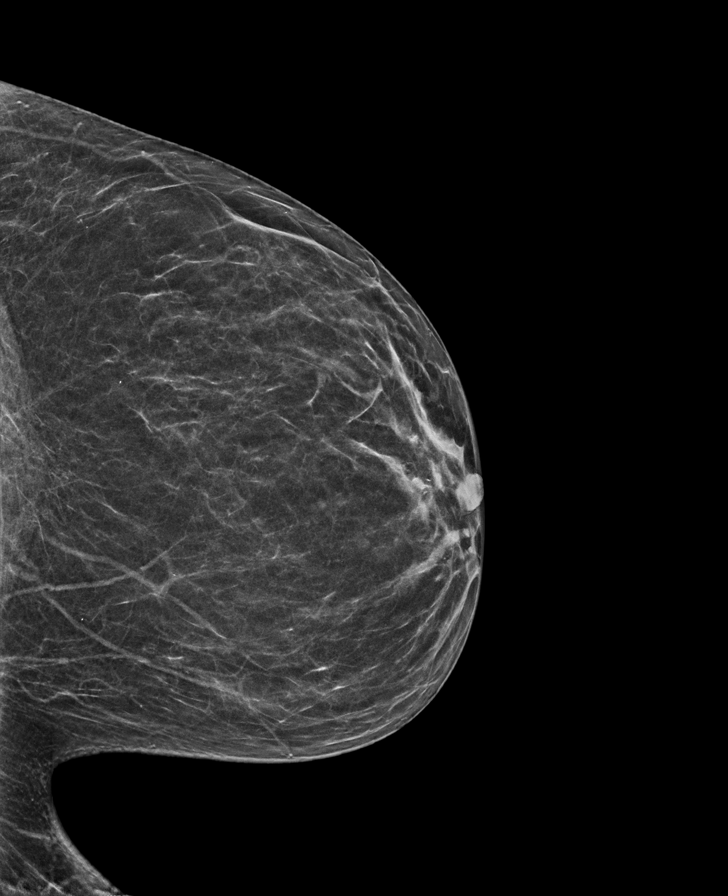

[L MLO synth-2D]
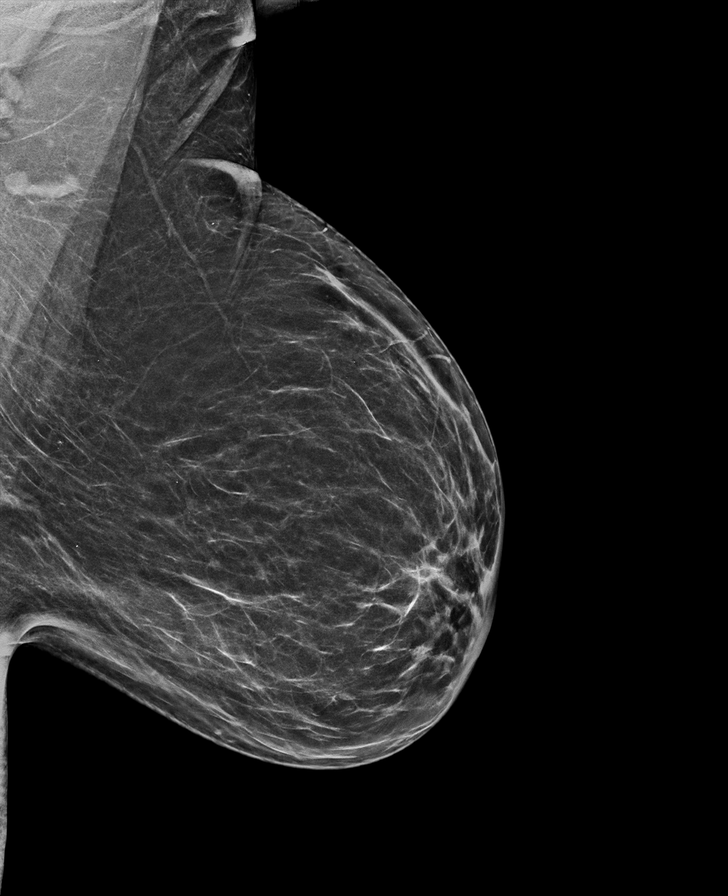

[R MLO synth-2D]
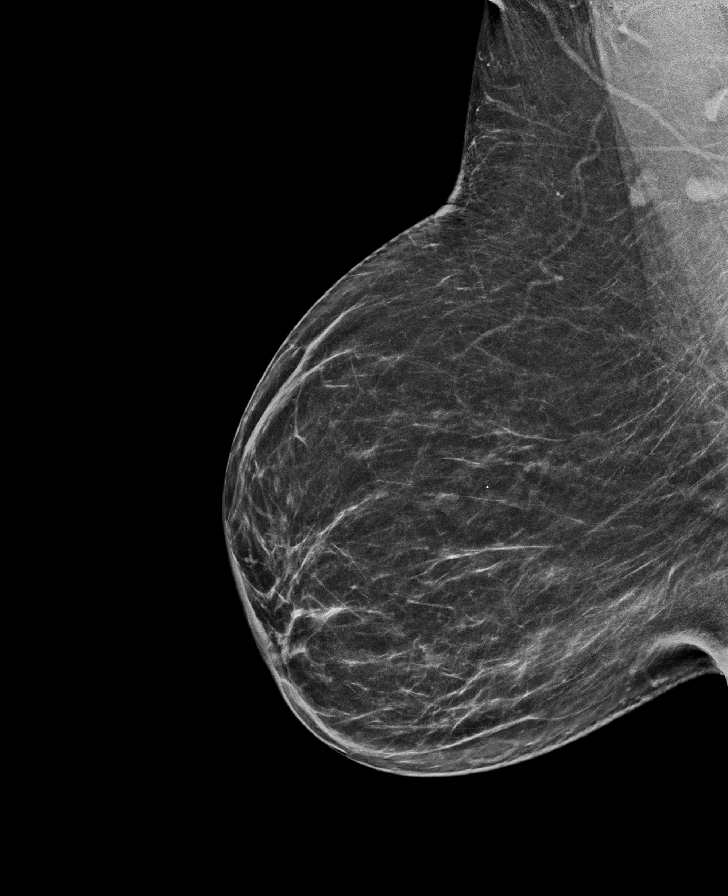

[R CC synth-2D]
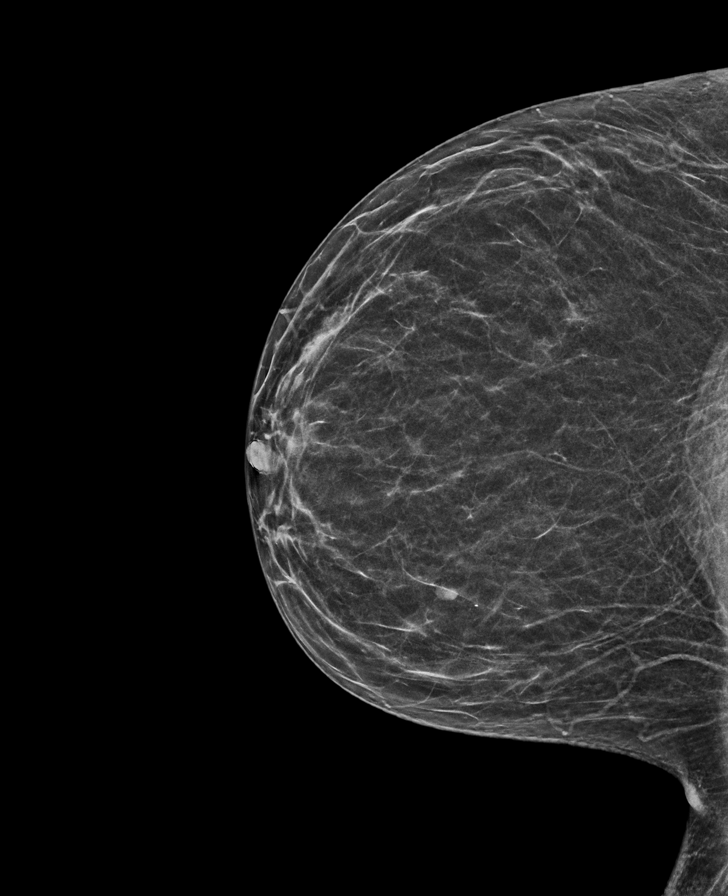

[L MLO tomo · tomo slice 33/64.0]
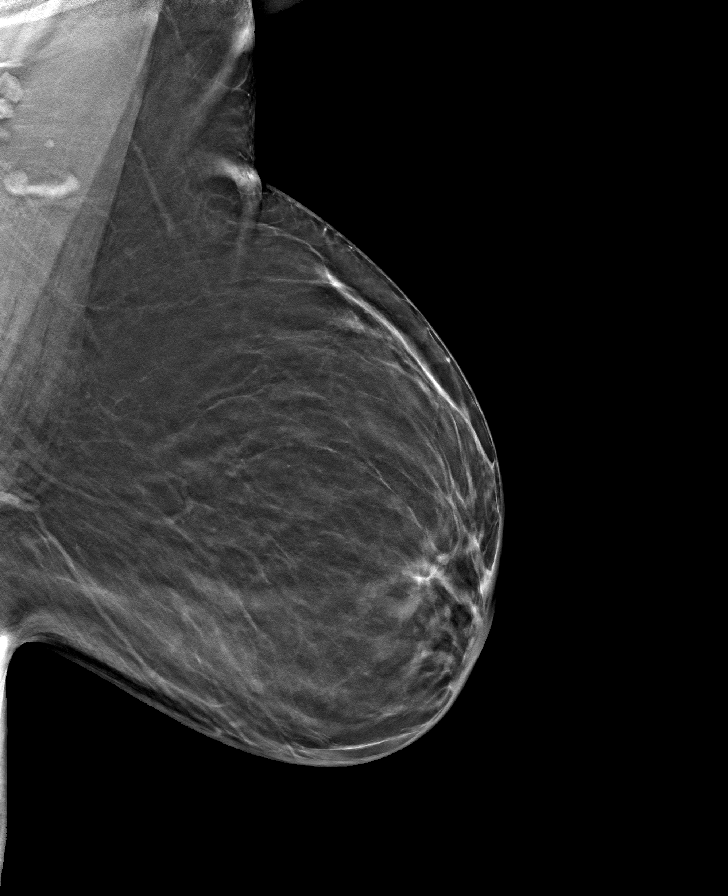

[R CC tomo · tomo slice 25/50.0]
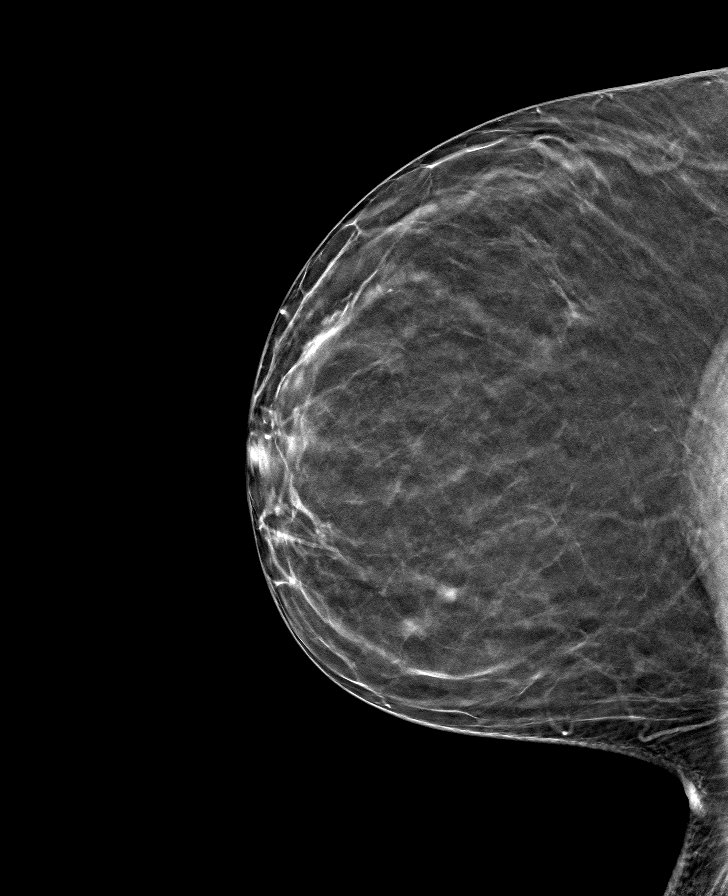

[L CC tomo · tomo slice 27/52.0]
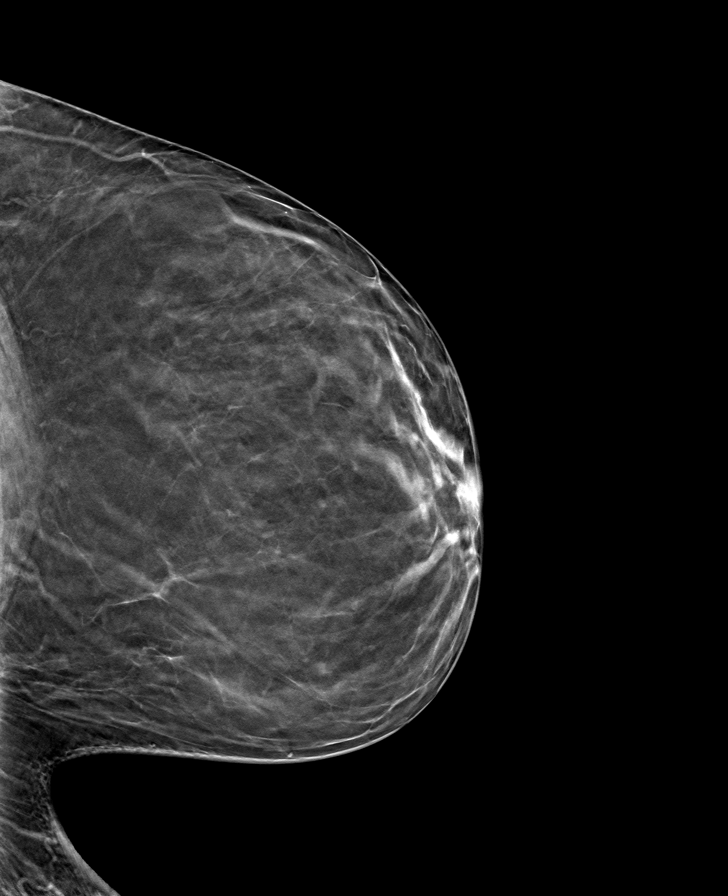

[R MLO tomo · tomo slice 31/62.0]
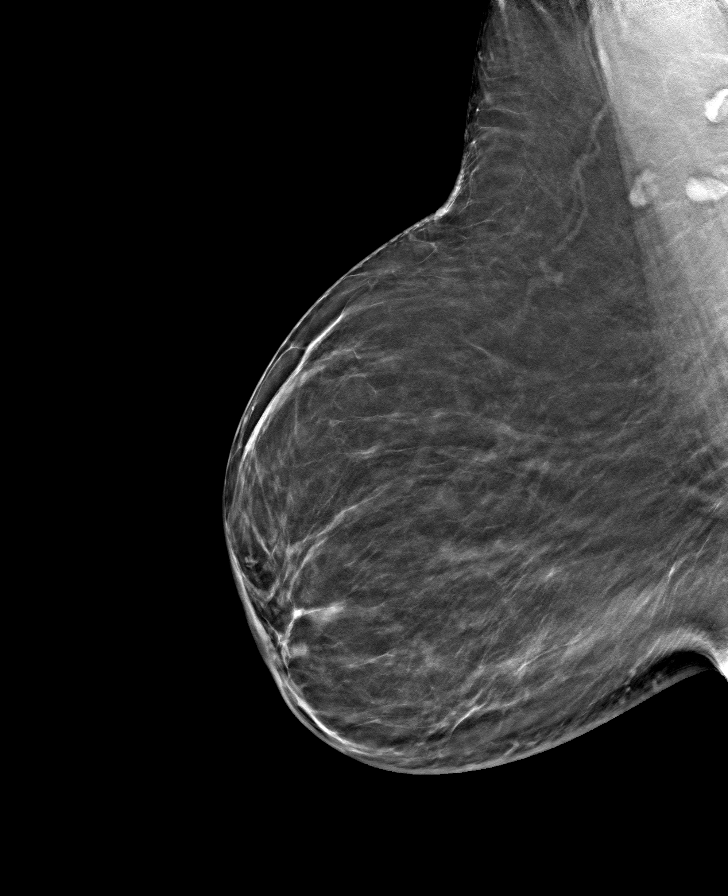

[8 of 24 positions shown; findings below may reference images not displayed]

ACR Breast Density Category b: There are scattered areas of
fibroglandular density.
FINDINGS: There are no findings suspicious for malignancy. Images were
processed with CAD.
IMPRESSION: No mammographic evidence of malignancy. A result letter of this
screening mammogram will be mailed directly to the patient.

RECOMMENDATION:
Screening mammogram in one year. (Code:[id number])

BI-RADS CATEGORY  1: Negative.
